# Patient Record
Sex: Female | Born: 1975 | Race: White | Hispanic: No | Smoking: Former smoker
Health system: Southern US, Community
[De-identification: ages and names within clinical notes are randomized; demographics above are authoritative.]

## PROBLEM LIST (undated history)

## (undated) DIAGNOSIS — N200 Calculus of kidney: Secondary | ICD-10-CM

## (undated) HISTORY — PX: BOWEL RESECTION: SHX1257

## (undated) HISTORY — PX: APPENDECTOMY: SHX54

## (undated) HISTORY — PX: TUBAL LIGATION: SHX77

---

## 1998-12-29 ENCOUNTER — Other Ambulatory Visit: Admission: RE | Admit: 1998-12-29 | Discharge: 1998-12-29 | Payer: Self-pay | Admitting: Physical Therapy

## 2000-01-12 ENCOUNTER — Other Ambulatory Visit: Admission: RE | Admit: 2000-01-12 | Discharge: 2000-01-12 | Payer: Self-pay | Admitting: Obstetrics and Gynecology

## 2000-06-05 ENCOUNTER — Encounter: Payer: Self-pay | Admitting: Surgery

## 2000-06-05 ENCOUNTER — Encounter (INDEPENDENT_AMBULATORY_CARE_PROVIDER_SITE_OTHER): Payer: Self-pay | Admitting: Specialist

## 2000-06-06 ENCOUNTER — Inpatient Hospital Stay (HOSPITAL_COMMUNITY): Admission: AD | Admit: 2000-06-06 | Discharge: 2000-06-07 | Payer: Self-pay | Admitting: Obstetrics and Gynecology

## 2000-11-06 ENCOUNTER — Other Ambulatory Visit: Admission: RE | Admit: 2000-11-06 | Discharge: 2000-11-06 | Payer: Self-pay | Admitting: Obstetrics and Gynecology

## 2001-06-26 ENCOUNTER — Other Ambulatory Visit: Admission: RE | Admit: 2001-06-26 | Discharge: 2001-06-26 | Payer: Self-pay | Admitting: Obstetrics and Gynecology

## 2001-10-06 ENCOUNTER — Emergency Department (HOSPITAL_COMMUNITY): Admission: EM | Admit: 2001-10-06 | Discharge: 2001-10-06 | Payer: Self-pay | Admitting: Emergency Medicine

## 2002-03-30 ENCOUNTER — Emergency Department (HOSPITAL_COMMUNITY): Admission: EM | Admit: 2002-03-30 | Discharge: 2002-03-30 | Payer: Self-pay | Admitting: Emergency Medicine

## 2002-09-20 ENCOUNTER — Other Ambulatory Visit: Admission: RE | Admit: 2002-09-20 | Discharge: 2002-09-20 | Payer: Self-pay | Admitting: Obstetrics and Gynecology

## 2002-12-29 ENCOUNTER — Emergency Department (HOSPITAL_COMMUNITY): Admission: EM | Admit: 2002-12-29 | Discharge: 2002-12-29 | Payer: Self-pay | Admitting: Emergency Medicine

## 2003-12-20 ENCOUNTER — Emergency Department (HOSPITAL_COMMUNITY): Admission: EM | Admit: 2003-12-20 | Discharge: 2003-12-21 | Payer: Self-pay | Admitting: Emergency Medicine

## 2004-11-03 ENCOUNTER — Other Ambulatory Visit: Admission: RE | Admit: 2004-11-03 | Discharge: 2004-11-03 | Payer: Self-pay | Admitting: Obstetrics and Gynecology

## 2004-11-26 ENCOUNTER — Inpatient Hospital Stay (HOSPITAL_COMMUNITY): Admission: AD | Admit: 2004-11-26 | Discharge: 2004-11-26 | Payer: Self-pay | Admitting: Obstetrics and Gynecology

## 2007-07-23 ENCOUNTER — Emergency Department (HOSPITAL_COMMUNITY): Admission: EM | Admit: 2007-07-23 | Discharge: 2007-07-23 | Payer: Self-pay | Admitting: Emergency Medicine

## 2008-01-02 ENCOUNTER — Emergency Department (HOSPITAL_COMMUNITY): Admission: EM | Admit: 2008-01-02 | Discharge: 2008-01-02 | Payer: Self-pay | Admitting: Emergency Medicine

## 2008-02-09 ENCOUNTER — Emergency Department (HOSPITAL_COMMUNITY): Admission: EM | Admit: 2008-02-09 | Discharge: 2008-02-09 | Payer: Self-pay | Admitting: Emergency Medicine

## 2008-03-03 ENCOUNTER — Emergency Department (HOSPITAL_COMMUNITY): Admission: EM | Admit: 2008-03-03 | Discharge: 2008-03-04 | Payer: Self-pay | Admitting: *Deleted

## 2008-05-23 ENCOUNTER — Emergency Department (HOSPITAL_COMMUNITY): Admission: EM | Admit: 2008-05-23 | Discharge: 2008-05-23 | Payer: Self-pay | Admitting: Emergency Medicine

## 2008-08-23 ENCOUNTER — Emergency Department (HOSPITAL_COMMUNITY): Admission: EM | Admit: 2008-08-23 | Discharge: 2008-08-23 | Payer: Self-pay | Admitting: Family Medicine

## 2009-01-11 ENCOUNTER — Emergency Department (HOSPITAL_COMMUNITY): Admission: EM | Admit: 2009-01-11 | Discharge: 2009-01-11 | Payer: Self-pay | Admitting: Family Medicine

## 2009-02-14 HISTORY — PX: OTHER SURGICAL HISTORY: SHX169

## 2010-05-26 LAB — POCT PREGNANCY, URINE: Preg Test, Ur: NEGATIVE

## 2010-07-02 NOTE — Discharge Summary (Signed)
[facility]  Patient:    [name], [name]                    MRN: [id number] Adm. Date:  [id number] Disc. Date: [id number] Attending:  Tobey Bride                           Discharge Summary  HISTORY OF PRESENT ILLNESS:   [name] is a 35 year old nulligravida who was evaluated for right lower quadrant pain.  Had normal ultrasound and pelvic examination.  Was sent to the emergency room for evaluation of appendicitis.  After two CAT scans and evaluation by Dr. Jamey Ripa, it is felt that this did not represent appendicitis and was admitted for pain control for right lower quadrant pain and what is presumed by CAT scan as being a right hydrosalpinx.  HOSPITAL COURSE:              Patient was admitted and placed on IV fluids and IV pain medication with morphine sulfate.  She remained afebrile and normal white count.  Because the pain did not improve on hospital day #1, proceeded with laparoscopy for evaluation of right lower quadrant pain.  At the time of surgery normal GYN structures were noted including normal uterus, tubes, ovaries, and pelvis.  Intraoperative consult with general surgery also revealed a normal appearing appendix.  However, at that time appendectomy was performed.  The surgery was uncomplicated.  The patients postoperative course was unremarkable with good return of bowel function, ambulation, and pain control.  Postoperative day #1 the right lower quadrant pain is absent and her postoperative course appears to be very normal.  She remains afebrile and will be discharged home.  DISPOSITION:                  Patient will be discharged home with followup in the office in one week.  She was sent home with routine instruction sheets for laparoscopy.  Told to return for increased fever, pain, or problems with the incision.  She was sent home with a prescription for Vicodin #30.  DISCHARGE DIAGNOSES:          Right lower quadrant  pain, probable early appendicitis. DD:  06/07/00 TD:  06/07/00 Job: [id number]

## 2010-07-02 NOTE — H&P (Signed)
[facility]  Patient:    [name], [name]                    MRN: [id number] Adm. Date:  [id number] Attending:  Rhina Brackett CC:         Trevor Iha, M.D.   History and Physical  ACCOUNT NUMBER:  [id number]  CHIEF COMPLAINT:  Abdominal pain.  CLINICAL HISTORY:  This patient is a 35 year old who woke up early this morning with right lower quadrant pain.  It has persisted and remained in the right lower quadrant.  She became nauseated shortly after it came on and she has not been hungry the rest of the day and remained somewhat nauseated.  She had some diarrhea right after she got up and the pain had begun and has not had any diarrhea since then.  She has not had any other recent GI symptoms. She has not eaten anything unusual, not had any fever or chills and has otherwise felt okay.  She supposedly had a low-grade temperature in the 99s but we do not have a record of that.  Because of her right lower quadrant pain, she was seen by her gynecologist who also apparently did a pelvic ultrasound which was negative.  He asked [place] to evaluate her for possible appendicitis.  The patient points to the right lower quadrant as the site of her pain.  PAST MEDICAL HISTORY:  Operations:  None.  MEDICATIONS:  Birth control pills.  ALLERGIES:  PENICILLIN and SULFA, both of which cause swelling.  HABITS:  Smokes occasionally.  FAMILY HISTORY:  Unremarkable.  REVIEW OF SYSTEMS:  HEENT:  Negative.  CHEST:  No cough or shortness of breath.  HEART:  No history of murmurs, hypertension or other cardiac problems.  GI:  Negative except for HPI.  GU:  Negative.  Has regular menstrual periods; last period was about a week ago.  EXTREMITIES:  Negative.  PHYSICAL EXAMINATION:  GENERAL:  Patient is a healthy-appearing female, alert, oriented and currently in no distress.  HEENT:  Head is normocephalic.  Eyes nonicteric.  Pupils are round  and regular.  NECK:  Supple.  No masses or thyromegaly.  LUNGS:  Clear to auscultation.  HEART:  Regular.  No murmurs, rubs, or gallops.  ABDOMEN:  Soft and nondistended.  She has some definite mild right lower quadrant tenderness but no guarding.  No rebound.  Bowel sounds are present.  PELVIC:  Pelvic was not done; it was negative by her gynecologist.  EXTREMITIES:  No cyanosis or edema.  LABORATORY DATA:  Laboratory studies currently available include negative urine pregnancy test and WBC of 8000 with hemoglobin of 11.2.  CMET is pending.  IMPRESSION:  Right lower quadrant pain, somewhat atypical for appendicitis, with normal white count.  PLAN:  Because of the atypical symptoms, we are going to try to get initially non-contrast CT to see if we can rule in or out appendicitis and depending on that, proceed either to a full CT or other depending on whether we can clearly identify the appendix.  We have discussed this with the patient and her mother, all questions answered. DD:  06/05/00 TD:  06/06/00 Job: [id number]

## 2010-07-02 NOTE — Op Note (Signed)
[facility]  Patient:    [name], [name]                    MRN: [id number] Proc. Date: 06/05/00 Adm. Date:  [id number] Disc. Date: [id number] Attending:  Tobey Bride CC:         Trevor Iha, M.D.   Operative Report  PREOPERATIVE DIAGNOSIS:       Abdominal pain of unknown etiology.  POSTOPERATIVE DIAGNOSIS:      Abdominal pain of unknown etiology.  OPERATION:                    Appendectomy.  SURGEON:                      Zigmund Daniel, M.D.  ASSISTANT:  ANESTHESIA:                   General anesthesia.  DESCRIPTION OF PROCEDURE:     The patient had been operated on by Trevor Iha, M.D., performing laparoscopy because of very acute right lower quadrant abdominal pain.  The ultrasound had shown hydrosalpinx and appendicitis was not suspected.  However, Dr. Rana Snare did not find findings consistent with cause for pelvic pain and he was concerned that the patient might need an appendectomy.  I was asked to consult and see the patient and I came to the OR and scrubbed in and did a laparoscopy.  Dr. Rana Snare went ahead and drained a small right ovarian cyst but he did not feel that was the cause of pain.  Small-bowel, gallbladder, colon, mesentery and peritoneal surfaces all looked completely normal.  The appendix was quite long but did not appear to be acutely inflamed.  There were a few adhesions around it.  I put in a second large port and dissecting with bipolar cautery, I took down the adhesions and freed up the mesentery and the appendix.  They were quite thin at the base and the appendix was very healthy-looking at the base.  I stapled the mesoappendix and appendix with one firing of the endoscopic vascular stapler and it made a quite secure amputation of the appendix with a secure closure of the mesentery.  No bleeding at all was noted.  I then removed the appendix in a plastic pouch through the second large port in the left  lower quadrant. Again, I looked around to make sure I found no abnormalities and none were found.  Dr. Rana Snare then concluded his examination and operation. DD:  06/06/00 TD:  06/07/00 Job: [id number]

## 2010-07-02 NOTE — Op Note (Signed)
[facility]  Patient:    [name], [name]                    MRN: [id number] Proc. Date: 06/06/00 Adm. Date:  [id number] Disc. Date: [id number] Attending:  Rhina Brackett                           Operative Report  PREOPERATIVE DIAGNOSIS:       Right lower quadrant pain, presumed right                               hydrosalpinx.  POSTOPERATIVE DIAGNOSIS:      Right lower quadrant pain, presumed                               appendicitis.  OPERATION:                    Diagnostic laparoscopy with appendectomy.  SURGEON:                      Trevor Iha, M.D.  ASSISTANT:                    Zigmund Daniel, M.D.  ANESTHESIA:                   General endotracheal anesthesia.  ESTIMATED BLOOD LOSS:         10 cc.  INDICATIONS:                  [name] is a 35 year old nulligravida white female who presented to my office on June 05, 2000, with acute onset of right lower quadrant pain at 3 a.m. associated with anorexia, nausea, and some low grade fever.  Ultrasound was performed which was normal.  The patient had right lower quadrant pain to palpation but no rebound.  Pelvic exam was normal.  She was sent to be evaluated by Currie Paris, M.D., at the [facility] Emergency Room and underwent laboratory evaluation as well as CAT scan.  They felt that it was not consistent with appendicitis.  The CAT scan showed a probable right hydrosalpinx.  She was admitted to [facility], continued on IV fluids and morphine for right lower quadrant pain.  On the morning of June 06, 2000, she continued to have the right lower quadrant pain; however, continued to remain afebrile and her white count was 6.5. Because of the persistent right lower quadrant pain and a possible right hydrosalpinx by CAT scan we planned to proceed with laparoscopy for evaluation and possible treatment of this.  Risks and benefits were discussed at  length, including but not limited to, risk of infection, bleeding, damage to bowel, bladder, uterus, tubes and ovaries.  The patient gave her informed consent.  FINDINGS AT SURGERY:          Normal-appearing uterus. Both tubes were normal. The was a small right follicular cyst on the right ovary.  The appendix was also grossly normal in appearance as was the liver and gallbladder.  DESCRIPTION OF PROCEDURE:     After adequate analgesia, the patient was placed in the dorsal lithotomy position.  She was sterilely prepped and draped.  The bladder was sterilely drained.  A Hulka tenaculum was placed on the  cervix.  A 1 cm infraumbilical skin incision was made.  Verres needle was inserted.  The abdomen was insufflated with dullness to percussion. The 11 mm trocar was inserted and the above findings were noted.  A 5 mm trocar was inserted to the left of midline two fingerbreadths above the pubic symphysis under direct visualization.  After examination of the pelvis, identifying normal-appearing tubes, ovaries bilaterally and the uterus, the right follicular cyst was drained and noted to have clear fluid.  Examination of the liver and gallbladder were normal.  The appendix appeared to be somewhat lengthy; however, no apparent induration.  I called Dr. Marcy Panning, general surgeon, for evaluation of the appendix.  At that time, he examined, he did also agree that it appeared to be grossly normal in appearance; however, because of her present pain, he felt that appendectomy would be indicated at this time.  At this time, he preceded to dissect the mesosalpinx with bipolar cautery and Endo shears.  He made a 1 cm incision on the left mid portion of the abdomen, inserted a 12 mm trocar.  Endo GIA was placed across the base of the mesosalpinx and appendix.  After firing of the Endo GIA, minimal appendiceal stump was left.  Good hemostasis was achieved.  The remaining portion of the appendix and  mesosalpinx were placed in the Endo catch bag and easily removed through the trocar site. At this time, examination of the cecum and the appendiceal stump revealed good hemostasis and no evidence of leaking.  At this time, the laparoscope was removed, the abdomen was desufflated, the trocar sites were closed.  The infraumbilical skin incision and mid abdominal incision were closed with 0 Vicryl in the fascia in a figure-of-eight.  The subcuticular layers were closed with 3-0 Vicryl Rapide in subcuticular fashion and the 5 mm site was closed with subcuticular suture of 3-0 Vicryl Rapide. They were injected with 0.25% Marcaine.  Tenaculum was removed from the cervix, noted to be hemostatic.  The patient tolerated the procedure well and was stable on transfer to the recovery room.  Estimated blood loss was 10 cc. The patient was given 1 g of cefotetan and will be continued in observation on the hospital floor for her right lower quadrant pain.  Sponge and instrument count was normal x 3. DD:  06/06/00 TD:  06/07/00 Job: [id number]

## 2012-03-27 ENCOUNTER — Other Ambulatory Visit: Payer: Self-pay

## 2012-03-27 LAB — OB RESULTS CONSOLE HEPATITIS B SURFACE ANTIGEN: Hepatitis B Surface Ag: NEGATIVE

## 2012-03-27 LAB — OB RESULTS CONSOLE RPR: RPR: NONREACTIVE

## 2012-05-15 ENCOUNTER — Ambulatory Visit (HOSPITAL_COMMUNITY)
Admission: RE | Admit: 2012-05-15 | Discharge: 2012-05-15 | Disposition: A | Discharge status: Home / Self Care | Payer: 59 | Source: Ambulatory Visit | Attending: Obstetrics and Gynecology | Admitting: Obstetrics and Gynecology

## 2012-05-15 ENCOUNTER — Other Ambulatory Visit: Payer: Self-pay

## 2012-05-15 ENCOUNTER — Encounter (HOSPITAL_COMMUNITY): Payer: Self-pay

## 2012-05-15 DIAGNOSIS — O09529 Supervision of elderly multigravida, unspecified trimester: Secondary | ICD-10-CM | POA: Insufficient documentation

## 2012-05-15 DIAGNOSIS — IMO0002 Reserved for concepts with insufficient information to code with codable children: Secondary | ICD-10-CM | POA: Insufficient documentation

## 2012-05-15 NOTE — Progress Notes (Signed)
Genetic Counseling  High-Risk Gestation Note  Appointment Date:  05/15/2012 Referred By: Turner Daniels, MD Date of Birth:  [birth year]    Pregnancy History: G2P0010 Estimated Date of Delivery: 10/04/12 Estimated Gestational Age: [redacted]w[redacted]d Attending: Particia Nearing, MD    Ms. [name] was seen for genetic counseling because of a maternal age of 37 years old at delivery.     She was counseled regarding maternal age and the association with risk for chromosome conditions due to nondisjunction with aging of the ova.   We reviewed chromosomes, nondisjunction, and the associated 1 in 141 risk for fetal aneuploidy at [redacted]w[redacted]d gestation related to a maternal age of 37 y.o. at delivery.  She was counseled that the risk for aneuploidy decreases as gestational age increases, accounting for those pregnancies which spontaneously abort.  We specifically discussed Down syndrome (trisomy 7), trisomies 86 and 13, and sex chromosome aneuploidies (47,XXX and 47,XXY) including the common features and prognoses of each.   We reviewed available screening options including Quad screen, noninvasive prenatal testing (NIPT), and detailed ultrasound. She understands that screening tests are used to modify a patient's a priori risk for aneuploidy, typically based on age.  This estimate provides a pregnancy specific risk assessment.  Specifically, we discussed that NIPT analyzes cell free fetal DNA found in the maternal circulation. This test is not diagnostic for chromosome conditions, but can provide information regarding the presence or absence of extra fetal DNA for chromosomes 13, 18, 21, X, and Y, and missing fetal DNA for chromosome X and Y (Turner syndrome). Thus, it would not identify or rule out all genetic conditions. The reported detection rate is greater than 99% for Trisomy 21, greater than 98% for Trisomy 18, and is approximately 80% (8 out of 10) for Trisomy 13. The false positive rate is reported to be less than  0.1% for any of these conditions.  In addition, we discussed that ~50-80% of fetuses with Down syndrome and up to 90-95% of fetuses with trisomy 18/13, when well visualized, have detectable anomalies or soft markers by detailed ultrasound (~18+ weeks gestation).   We reviewed that ultrasound was performed in Ms. [name] OB office on 05/10/12. Ultrasound reported normal visualized fetal anatomy. An echogenic intracardiac focus was reportedly visualized at that time. An isolated echogenic focus is generally believed to be a normal variation without any concerns for the pregnancy.  Isolated echogenic cardiac foci are not associated with congenital heart defects in the baby or compromised cardiac function after birth.  However, an echogenic cardiac focus is associated with a slightly increased chance for Down syndrome in the pregnancy. Thus, the presence of an EIF would increase the risk for Down syndrome above the patient's age related risk of 1 in 256 to approximately 1 in 128. Ultrasound was not performed at the time of today's visit.   Ms. [name] previously attempted NIPT through her primary OB office on 03/27/12 and 04/16/12. Both samples reportedly had insufficient quantity of DNA. We reviewed that the analysis is not able to accurately be performed in samples with low amounts of cell free fetal DNA. We discussed that typically the amount of cell free fetal DNA increases throughout gestation, with a more significant increase typically occurring in the early third trimester. However, we discussed that there are some pregnancies that will not have an adequate increase in the amount of cell free fetal DNA in maternal serum regardless of gestational age.  She was also counseled regarding diagnostic testing via amniocentesis.  We reviewed the approximate 1 in 300-500 risk for complications for amniocentesis, including spontaneous pregnancy loss. We discussed the risks, limitations, and benefits of  each screening and testing option. After consideration of all the options, Ms. [name] elected to attempt NIPT again [facility]). However, she also elected to proceed with Quad screen at the time of today's visit; in the case that the third NIPT sample also does not yield a result, she will have risk assessment provided via Quad screen. She understands that Quad screen has decreased detection rate and increased false positive rate compared to NIPT. However, she also understands that Quad result is not dependent upon the amount of fetal DNA in maternal blood and that the detection rate for the conditions screened is increased above maternal age as a screening tool. We will contact the patient once results are available from these screens or if NIPT is unable to be performed due to cell free fetal DNA fraction in sample. She understands that screening cannot rule out all birth defects or genetic syndromes. The patient was advised of this limitation and states she still does not want diagnostic testing at this time or in the future given the associated risk of complications.   Ms. [name] was provided with written information regarding cystic fibrosis (CF) including the carrier frequency and incidence in the Caucasian population, the availability of carrier testing and prenatal diagnosis if indicated.  In addition, we discussed that CF is routinely screened for as part of the [place] newborn screening panel.  She declined testing today.   Both family histories were reviewed and found to be noncontributory for birth defects, mental retardation, and known genetic conditions. The father of the pregnancy was adopted, and he does not have information regarding his biological family history.  We, therefore, cannot comment on how his history might contribute to the overall chance for the baby to have a birth defect. Without further information regarding the provided family history, an accurate genetic risk  cannot be calculated. Further genetic counseling is warranted if more information is obtained.  Ms. [name] denied exposure to environmental toxins or chemical agents. She denied the use of alcohol, tobacco or street drugs. She denied significant viral illnesses during the course of her pregnancy. Her medical and surgical histories were noncontributory.   I counseled Ms. [name] regarding the above risks and available options.  The approximate face-to-face time with the genetic counselor was 40 minutes.  Alicia Plowman, MS,  Certified Genetic Counselor 05/15/2012

## 2012-05-18 ENCOUNTER — Telehealth (HOSPITAL_COMMUNITY): Payer: Self-pay | Admitting: MS"

## 2012-05-18 NOTE — Telephone Encounter (Signed)
Patient returned call. Called Ms. [name] to discuss results of Quad screen, which are within normal range for the conditions screened. We reviewed the reduction in risks for fetal Down syndrome (1 in 401 to 1 in 2,324), trisomy 18 (1 in 1,205 to 1 in 9,588), and ONTDs. Quad screen detects approximately 75-80% of Down syndrome and approximately 60-65% of trisomy 18. She understands that this screen does not diagnose or rule out these conditions and does not assess for additional genetic or chromosome conditions.   Cell free fetal DNA test results are currently pending. Will call patient once results are available or if lab is unable to obtain result due to low fetal fraction. All questions were answered to her satisfaction. Patient was encouraged to call with additional questions or concerns.   Clydie Braun Myrick Mcnairy 05/18/2012 9:37 AM    Left message for patient to call back regarding "good news."  Clydie Braun Harlem Bula 05/18/2012 9:28 AM

## 2012-05-24 ENCOUNTER — Telehealth (HOSPITAL_COMMUNITY): Payer: Self-pay | Admitting: MS"

## 2012-05-24 NOTE — Telephone Encounter (Signed)
Called Donya Minichiello to discuss her [place], cell free fetal DNA testing. Patient was identified by name and DOB. Testing was offered because of advanced maternal age. We reviewed that a result was not able to be obtained due to insufficient fetal cell free DNA in the sample. Ms. Soyars attempted noninvasive prenatal screening two times previously in the pregnancy. Discussed that the laboratories performing this testing have indicated that there is a subset of the population who does not appear to have sufficient cell free fetal DNA in maternal blood stream regardless of gestation for unknown reasons. However, in general, there does seem to be an increase in cell free fetal DNA in maternal blood in the third trimester if the patient wanted to attempt another re-draw. Ms. Ribble expressed that she was hoping for a result but that she is comfortable given that the had risk assessment performed via Quad screen, which reduced the risks for the conditions screened below her age-related risks. Reviewed the option of amniocentesis. Ms. Kirchner indicated that she is not interested in amniocentesis. She plans to discuss this information with her primary OB at her next visit. Reviewed that her OB office should have a copy of the Quad screen result and will get information regarding the non-reportable NIPS result as well.    Quinn Plowman, MS Certified Genetic Counselor 05/24/2012 3:02 PM

## 2012-09-07 LAB — OB RESULTS CONSOLE GBS: GBS: NEGATIVE

## 2012-09-29 ENCOUNTER — Encounter (HOSPITAL_COMMUNITY)
Admission: AD | Disposition: A | Discharge status: Home / Self Care | Payer: Self-pay | Source: Ambulatory Visit | Attending: Obstetrics and Gynecology

## 2012-09-29 ENCOUNTER — Inpatient Hospital Stay (HOSPITAL_COMMUNITY)
Admission: AD | Admit: 2012-09-29 | Discharge: 2012-10-01 | DRG: 766 | Disposition: A | Discharge status: Home / Self Care | Payer: 59 | Source: Ambulatory Visit | Attending: Obstetrics and Gynecology | Admitting: Obstetrics and Gynecology

## 2012-09-29 ENCOUNTER — Encounter (HOSPITAL_COMMUNITY): Payer: Self-pay | Admitting: Anesthesiology

## 2012-09-29 ENCOUNTER — Encounter (HOSPITAL_COMMUNITY): Payer: Self-pay | Admitting: *Deleted

## 2012-09-29 ENCOUNTER — Inpatient Hospital Stay (HOSPITAL_COMMUNITY): Payer: 59 | Admitting: Anesthesiology

## 2012-09-29 DIAGNOSIS — Z98891 History of uterine scar from previous surgery: Secondary | ICD-10-CM

## 2012-09-29 DIAGNOSIS — Z880 Allergy status to penicillin: Secondary | ICD-10-CM

## 2012-09-29 DIAGNOSIS — O139 Gestational [pregnancy-induced] hypertension without significant proteinuria, unspecified trimester: Principal | ICD-10-CM | POA: Diagnosis present

## 2012-09-29 DIAGNOSIS — O09529 Supervision of elderly multigravida, unspecified trimester: Secondary | ICD-10-CM | POA: Diagnosis present

## 2012-09-29 LAB — CBC
MCV: 85 fL (ref 78.0–100.0)
Platelets: 286 10*3/uL (ref 150–400)
RBC: 4.27 MIL/uL (ref 3.87–5.11)
RDW: 12.9 % (ref 11.5–15.5)
WBC: 13.8 10*3/uL — ABNORMAL HIGH (ref 4.0–10.5)

## 2012-09-29 LAB — POCT FERN TEST: POCT Fern Test: NEGATIVE

## 2012-09-29 LAB — RPR: RPR Ser Ql: NONREACTIVE

## 2012-09-29 SURGERY — Surgical Case
Anesthesia: Epidural | Site: Abdomen | Wound class: Clean Contaminated

## 2012-09-29 MED ORDER — PHENYLEPHRINE 40 MCG/ML (10ML) SYRINGE FOR IV PUSH (FOR BLOOD PRESSURE SUPPORT): 80.0000 ug | PREFILLED_SYRINGE | INTRAVENOUS | Status: DC | PRN

## 2012-09-29 MED ORDER — ONDANSETRON HCL 4 MG/2ML IJ SOLN: 4.0000 mg | Freq: Four times a day (QID) | INTRAMUSCULAR | Status: DC | PRN

## 2012-09-29 MED ORDER — KETOROLAC TROMETHAMINE 30 MG/ML IJ SOLN
30.0000 mg | Freq: Four times a day (QID) | INTRAMUSCULAR | Status: AC | PRN
  Administered 2012-09-30: 30 mg via INTRAVENOUS
  Filled 2012-09-29: qty 1

## 2012-09-29 MED ORDER — KETOROLAC TROMETHAMINE 30 MG/ML IJ SOLN: 30.0000 mg | Freq: Four times a day (QID) | INTRAMUSCULAR | Status: AC | PRN

## 2012-09-29 MED ORDER — KETOROLAC TROMETHAMINE 60 MG/2ML IM SOLN
INTRAMUSCULAR | Status: AC
  Administered 2012-09-29: 60 mg via INTRAMUSCULAR
  Filled 2012-09-29: qty 2

## 2012-09-29 MED ORDER — EPHEDRINE 5 MG/ML INJ
10.0000 mg | INTRAVENOUS | Status: DC | PRN
  Filled 2012-09-29: qty 4

## 2012-09-29 MED ORDER — SIMETHICONE 80 MG PO CHEW: 80.0000 mg | CHEWABLE_TABLET | ORAL | Status: DC | PRN

## 2012-09-29 MED ORDER — OXYTOCIN 10 UNIT/ML IJ SOLN
INTRAMUSCULAR | Status: AC
  Filled 2012-09-29: qty 4

## 2012-09-29 MED ORDER — LACTATED RINGERS IV SOLN
500.0000 mL | Freq: Once | INTRAVENOUS | Status: AC
  Administered 2012-09-29: 500 mL via INTRAVENOUS

## 2012-09-29 MED ORDER — METOCLOPRAMIDE HCL 5 MG/ML IJ SOLN: 10.0000 mg | Freq: Three times a day (TID) | INTRAMUSCULAR | Status: DC | PRN

## 2012-09-29 MED ORDER — 0.9 % SODIUM CHLORIDE (POUR BTL) OPTIME
TOPICAL | Status: DC | PRN
  Administered 2012-09-29: 600 mL

## 2012-09-29 MED ORDER — ONDANSETRON HCL 4 MG/2ML IJ SOLN
INTRAMUSCULAR | Status: AC
  Filled 2012-09-29: qty 2

## 2012-09-29 MED ORDER — LACTATED RINGERS IV SOLN: INTRAVENOUS | Status: DC

## 2012-09-29 MED ORDER — OXYTOCIN BOLUS FROM INFUSION: 500.0000 mL | INTRAVENOUS | Status: DC

## 2012-09-29 MED ORDER — LIDOCAINE HCL (PF) 1 % IJ SOLN
INTRAMUSCULAR | Status: DC | PRN
  Administered 2012-09-29 (×3): 5 mL

## 2012-09-29 MED ORDER — FENTANYL CITRATE 0.05 MG/ML IJ SOLN
25.0000 ug | INTRAMUSCULAR | Status: DC | PRN
  Administered 2012-09-29: 50 ug via INTRAVENOUS

## 2012-09-29 MED ORDER — SENNOSIDES-DOCUSATE SODIUM 8.6-50 MG PO TABS
2.0000 | ORAL_TABLET | Freq: Every day | ORAL | Status: DC
  Administered 2012-09-30: 2 via ORAL

## 2012-09-29 MED ORDER — OXYTOCIN 40 UNITS IN LACTATED RINGERS INFUSION - SIMPLE MED
1.0000 m[IU]/min | INTRAVENOUS | Status: DC
  Administered 2012-09-29: 2 m[IU]/min via INTRAVENOUS

## 2012-09-29 MED ORDER — ONDANSETRON HCL 4 MG/2ML IJ SOLN
INTRAMUSCULAR | Status: DC | PRN
  Administered 2012-09-29: 4 mg via INTRAVENOUS

## 2012-09-29 MED ORDER — LACTATED RINGERS IV SOLN
INTRAVENOUS | Status: DC
  Administered 2012-09-29 (×2): via INTRAVENOUS

## 2012-09-29 MED ORDER — OXYTOCIN 10 UNIT/ML IJ SOLN
40.0000 [IU] | INTRAVENOUS | Status: DC | PRN
  Administered 2012-09-29: 40 [IU] via INTRAVENOUS

## 2012-09-29 MED ORDER — FLEET ENEMA 7-19 GM/118ML RE ENEM: 1.0000 | ENEMA | Freq: Once | RECTAL | Status: DC

## 2012-09-29 MED ORDER — MEPERIDINE HCL 25 MG/ML IJ SOLN: 6.2500 mg | INTRAMUSCULAR | Status: DC | PRN

## 2012-09-29 MED ORDER — NALOXONE HCL 0.4 MG/ML IJ SOLN: 0.4000 mg | INTRAMUSCULAR | Status: DC | PRN

## 2012-09-29 MED ORDER — IBUPROFEN 600 MG PO TABS
600.0000 mg | ORAL_TABLET | Freq: Four times a day (QID) | ORAL | Status: DC
  Administered 2012-09-30 – 2012-10-01 (×6): 600 mg via ORAL
  Filled 2012-09-29 (×6): qty 1

## 2012-09-29 MED ORDER — SODIUM BICARBONATE 8.4 % IV SOLN
INTRAVENOUS | Status: DC | PRN
  Administered 2012-09-29 (×2): 5 mL via EPIDURAL

## 2012-09-29 MED ORDER — NALOXONE HCL 1 MG/ML IJ SOLN
1.0000 ug/kg/h | INTRAVENOUS | Status: DC | PRN
  Filled 2012-09-29: qty 2

## 2012-09-29 MED ORDER — SCOPOLAMINE 1 MG/3DAYS TD PT72
1.0000 | MEDICATED_PATCH | Freq: Once | TRANSDERMAL | Status: DC
  Administered 2012-09-29: 1.5 mg via TRANSDERMAL

## 2012-09-29 MED ORDER — FENTANYL 2.5 MCG/ML BUPIVACAINE 1/10 % EPIDURAL INFUSION (WH - ANES)
14.0000 mL/h | INTRAMUSCULAR | Status: DC | PRN
  Filled 2012-09-29: qty 125

## 2012-09-29 MED ORDER — FENTANYL 2.5 MCG/ML BUPIVACAINE 1/10 % EPIDURAL INFUSION (WH - ANES)
INTRAMUSCULAR | Status: DC | PRN
  Administered 2012-09-29: 14 mL/h via EPIDURAL

## 2012-09-29 MED ORDER — DIPHENHYDRAMINE HCL 50 MG/ML IJ SOLN: 12.5000 mg | INTRAMUSCULAR | Status: DC | PRN

## 2012-09-29 MED ORDER — MORPHINE SULFATE (PF) 0.5 MG/ML IJ SOLN
INTRAMUSCULAR | Status: DC | PRN
  Administered 2012-09-29: 4 mg via EPIDURAL

## 2012-09-29 MED ORDER — ONDANSETRON HCL 4 MG/2ML IJ SOLN: 4.0000 mg | INTRAMUSCULAR | Status: DC | PRN

## 2012-09-29 MED ORDER — NALBUPHINE HCL 10 MG/ML IJ SOLN
5.0000 mg | INTRAMUSCULAR | Status: DC | PRN
  Filled 2012-09-29: qty 1

## 2012-09-29 MED ORDER — DIPHENHYDRAMINE HCL 25 MG PO CAPS: 25.0000 mg | ORAL_CAPSULE | Freq: Four times a day (QID) | ORAL | Status: DC | PRN

## 2012-09-29 MED ORDER — LACTATED RINGERS IV SOLN
500.0000 mL | INTRAVENOUS | Status: DC | PRN
  Administered 2012-09-29: 500 mL via INTRAVENOUS

## 2012-09-29 MED ORDER — MORPHINE SULFATE 0.5 MG/ML IJ SOLN
INTRAMUSCULAR | Status: AC
  Filled 2012-09-29: qty 10

## 2012-09-29 MED ORDER — ZOLPIDEM TARTRATE 5 MG PO TABS: 5.0000 mg | ORAL_TABLET | Freq: Every evening | ORAL | Status: DC | PRN

## 2012-09-29 MED ORDER — SODIUM BICARBONATE 8.4 % IV SOLN
INTRAVENOUS | Status: AC
  Filled 2012-09-29: qty 50

## 2012-09-29 MED ORDER — LANOLIN HYDROUS EX OINT: 1.0000 "application " | TOPICAL_OINTMENT | CUTANEOUS | Status: DC | PRN

## 2012-09-29 MED ORDER — MORPHINE SULFATE (PF) 0.5 MG/ML IJ SOLN
INTRAMUSCULAR | Status: DC | PRN
  Administered 2012-09-29: 1 mg via EPIDURAL

## 2012-09-29 MED ORDER — ACETAMINOPHEN 325 MG PO TABS: 650.0000 mg | ORAL_TABLET | ORAL | Status: DC | PRN

## 2012-09-29 MED ORDER — TETANUS-DIPHTH-ACELL PERTUSSIS 5-2.5-18.5 LF-MCG/0.5 IM SUSP: 0.5000 mL | Freq: Once | INTRAMUSCULAR | Status: DC

## 2012-09-29 MED ORDER — OXYCODONE-ACETAMINOPHEN 5-325 MG PO TABS
1.0000 | ORAL_TABLET | ORAL | Status: DC | PRN
  Administered 2012-09-30 (×2): 1 via ORAL
  Administered 2012-09-30: 2 via ORAL
  Administered 2012-09-30: 1 via ORAL
  Administered 2012-09-30: 2 via ORAL
  Administered 2012-10-01: 1 via ORAL
  Administered 2012-10-01 (×2): 2 via ORAL
  Filled 2012-09-29 (×2): qty 2
  Filled 2012-09-29 (×2): qty 1
  Filled 2012-09-29 (×3): qty 2
  Filled 2012-09-29: qty 1

## 2012-09-29 MED ORDER — DEXTROSE 5 % IV SOLN
2.0000 g | Freq: Two times a day (BID) | INTRAVENOUS | Status: DC
  Administered 2012-09-29: 2 g via INTRAVENOUS
  Filled 2012-09-29 (×2): qty 2

## 2012-09-29 MED ORDER — SCOPOLAMINE 1 MG/3DAYS TD PT72
MEDICATED_PATCH | TRANSDERMAL | Status: AC
  Filled 2012-09-29: qty 1

## 2012-09-29 MED ORDER — OXYCODONE-ACETAMINOPHEN 5-325 MG PO TABS: 1.0000 | ORAL_TABLET | ORAL | Status: DC | PRN

## 2012-09-29 MED ORDER — LACTATED RINGERS IV SOLN
INTRAVENOUS | Status: DC
  Administered 2012-09-29: 18:00:00 via INTRAUTERINE

## 2012-09-29 MED ORDER — PRENATAL MULTIVITAMIN CH
1.0000 | ORAL_TABLET | Freq: Every day | ORAL | Status: DC
  Administered 2012-09-30 – 2012-10-01 (×2): 1 via ORAL
  Filled 2012-09-29 (×2): qty 1

## 2012-09-29 MED ORDER — CITRIC ACID-SODIUM CITRATE 334-500 MG/5ML PO SOLN
30.0000 mL | ORAL | Status: DC | PRN
  Administered 2012-09-29 (×2): 30 mL via ORAL
  Filled 2012-09-29 (×2): qty 15

## 2012-09-29 MED ORDER — MENTHOL 3 MG MT LOZG: 1.0000 | LOZENGE | OROMUCOSAL | Status: DC | PRN

## 2012-09-29 MED ORDER — EPHEDRINE 5 MG/ML INJ: 10.0000 mg | INTRAVENOUS | Status: DC | PRN

## 2012-09-29 MED ORDER — MEPERIDINE HCL 25 MG/ML IJ SOLN
INTRAMUSCULAR | Status: DC | PRN
  Administered 2012-09-29 (×2): 12.5 mg via INTRAVENOUS

## 2012-09-29 MED ORDER — SIMETHICONE 80 MG PO CHEW
80.0000 mg | CHEWABLE_TABLET | Freq: Three times a day (TID) | ORAL | Status: DC
  Administered 2012-09-30 – 2012-10-01 (×5): 80 mg via ORAL

## 2012-09-29 MED ORDER — LIDOCAINE-EPINEPHRINE (PF) 2 %-1:200000 IJ SOLN
INTRAMUSCULAR | Status: AC
  Filled 2012-09-29: qty 20

## 2012-09-29 MED ORDER — ONDANSETRON HCL 4 MG PO TABS: 4.0000 mg | ORAL_TABLET | ORAL | Status: DC | PRN

## 2012-09-29 MED ORDER — MEPERIDINE HCL 25 MG/ML IJ SOLN
INTRAMUSCULAR | Status: AC
  Filled 2012-09-29: qty 1

## 2012-09-29 MED ORDER — SODIUM CHLORIDE 0.9 % IJ SOLN: 3.0000 mL | INTRAMUSCULAR | Status: DC | PRN

## 2012-09-29 MED ORDER — FENTANYL CITRATE 0.05 MG/ML IJ SOLN
INTRAMUSCULAR | Status: AC
  Administered 2012-09-29: 50 ug via INTRAVENOUS
  Filled 2012-09-29: qty 2

## 2012-09-29 MED ORDER — OXYTOCIN 40 UNITS IN LACTATED RINGERS INFUSION - SIMPLE MED
62.5000 mL/h | INTRAVENOUS | Status: DC
  Filled 2012-09-29: qty 1000

## 2012-09-29 MED ORDER — OXYTOCIN 40 UNITS IN LACTATED RINGERS INFUSION - SIMPLE MED: 62.5000 mL/h | INTRAVENOUS | Status: AC

## 2012-09-29 MED ORDER — DIPHENHYDRAMINE HCL 50 MG/ML IJ SOLN: 25.0000 mg | INTRAMUSCULAR | Status: DC | PRN

## 2012-09-29 MED ORDER — TERBUTALINE SULFATE 1 MG/ML IJ SOLN: 0.2500 mg | Freq: Once | INTRAMUSCULAR | Status: DC | PRN

## 2012-09-29 MED ORDER — WITCH HAZEL-GLYCERIN EX PADS: 1.0000 "application " | MEDICATED_PAD | CUTANEOUS | Status: DC | PRN

## 2012-09-29 MED ORDER — LACTATED RINGERS IV SOLN
INTRAVENOUS | Status: DC | PRN
  Administered 2012-09-29: 20:00:00 via INTRAVENOUS

## 2012-09-29 MED ORDER — DIBUCAINE 1 % RE OINT: 1.0000 "application " | TOPICAL_OINTMENT | RECTAL | Status: DC | PRN

## 2012-09-29 MED ORDER — IBUPROFEN 600 MG PO TABS: 600.0000 mg | ORAL_TABLET | Freq: Four times a day (QID) | ORAL | Status: DC | PRN

## 2012-09-29 MED ORDER — ONDANSETRON HCL 4 MG/2ML IJ SOLN: 4.0000 mg | Freq: Three times a day (TID) | INTRAMUSCULAR | Status: DC | PRN

## 2012-09-29 MED ORDER — DIPHENHYDRAMINE HCL 25 MG PO CAPS: 25.0000 mg | ORAL_CAPSULE | ORAL | Status: DC | PRN

## 2012-09-29 MED ORDER — PHENYLEPHRINE 40 MCG/ML (10ML) SYRINGE FOR IV PUSH (FOR BLOOD PRESSURE SUPPORT)
80.0000 ug | PREFILLED_SYRINGE | INTRAVENOUS | Status: DC | PRN
  Filled 2012-09-29: qty 5

## 2012-09-29 MED ORDER — LIDOCAINE HCL (PF) 1 % IJ SOLN: 30.0000 mL | INTRAMUSCULAR | Status: DC | PRN

## 2012-09-29 MED ORDER — KETOROLAC TROMETHAMINE 60 MG/2ML IM SOLN: 60.0000 mg | Freq: Once | INTRAMUSCULAR | Status: AC | PRN

## 2012-09-29 SURGICAL SUPPLY — 26 items
CLAMP CORD UMBIL (MISCELLANEOUS) IMPLANT
CLOTH BEACON ORANGE TIMEOUT ST (SAFETY) ×2 IMPLANT
DRAPE LG THREE QUARTER DISP (DRAPES) ×2 IMPLANT
DRSG OPSITE POSTOP 4X10 (GAUZE/BANDAGES/DRESSINGS) ×2 IMPLANT
DURAPREP 26ML APPLICATOR (WOUND CARE) ×2 IMPLANT
ELECT REM PT RETURN 9FT ADLT (ELECTROSURGICAL) ×2
ELECTRODE REM PT RTRN 9FT ADLT (ELECTROSURGICAL) ×1 IMPLANT
EXTRACTOR VACUUM M CUP 4 TUBE (SUCTIONS) IMPLANT
GLOVE SURG ORTHO 8.0 STRL STRW (GLOVE) ×2 IMPLANT
GOWN STRL REIN XL XLG (GOWN DISPOSABLE) ×4 IMPLANT
KIT ABG SYR 3ML LUER SLIP (SYRINGE) ×2 IMPLANT
NDL HYPO 25X5/8 SAFETYGLIDE (NEEDLE) ×1 IMPLANT
NEEDLE HYPO 25X5/8 SAFETYGLIDE (NEEDLE) ×2 IMPLANT
NS IRRIG 1000ML POUR BTL (IV SOLUTION) ×2 IMPLANT
PACK C SECTION WH (CUSTOM PROCEDURE TRAY) ×2 IMPLANT
PAD OB MATERNITY 4.3X12.25 (PERSONAL CARE ITEMS) ×2 IMPLANT
STAPLER VISISTAT 35W (STAPLE) IMPLANT
SUT MNCRL 0 VIOLET CTX 36 (SUTURE) ×3 IMPLANT
SUT MONOCRYL 0 CTX 36 (SUTURE) ×3
SUT PDS AB 1 CT  36 (SUTURE)
SUT PDS AB 1 CT 36 (SUTURE) IMPLANT
SUT VIC AB 1 CTX 36 (SUTURE)
SUT VIC AB 1 CTX36XBRD ANBCTRL (SUTURE) IMPLANT
TOWEL OR 17X24 6PK STRL BLUE (TOWEL DISPOSABLE) ×2 IMPLANT
TRAY FOLEY CATH 14FR (SET/KITS/TRAYS/PACK) ×2 IMPLANT
WATER STERILE IRR 1000ML POUR (IV SOLUTION) ×2 IMPLANT

## 2012-09-29 NOTE — Op Note (Signed)
Cesarean Section Procedure Note  Pre-operative Diagnosis: IUP at 39 3/7, nonreassuring FHR, recurrent late decels  Post-operative Diagnosis: same + OP presentation  Surgeon: Raihan Kimmel C   Assistants: none  Anesthesia:epidural  Procedure:  Low Segment Transverse cesarean section  Procedure Details  The patient was seen in the Holding Room. The risks, benefits, complications, treatment options, and expected outcomes were discussed with the patient.  The patient concurred with the proposed plan, giving informed consent.  The site of surgery properly noted/marked.. A Time Out was held and the above information confirmed.  After induction of anesthesia, the patient was draped and prepped in the usual sterile manner. A Pfannenstiel incision was made and carried down through the subcutaneous tissue to the fascia. Fascial incision was made and extended transversely. The fascia was separated from the underlying rectus tissue superiorly and inferiorly. The peritoneum was identified and entered. Peritoneal incision was extended longitudinally. The utero-vesical peritoneal reflection was incised transversely and the bladder flap was bluntly freed from the lower uterine segment. A low transverse uterine incision was made. Delivered from op presentation was a baby with Apgar scores of 9 at one minute and 9 at five minutes. After the umbilical cord was clamped and cut cord blood was obtained for evaluation. The placenta was removed intact and appeared normal. The uterine outline, tubes and ovaries appeared normal. The uterine incision was closed with running locked sutures of 0 monocryl and imbricated with 0 monocryl. Hemostasis was observed. Lavage was carried out until clear. The peritoneum was then closed with 0 monocryl and rectus muscles plicated in the midline.  After hemostasis was assured, the fascia was then reapproximated with running sutures of 0 Vicryl. Irrigation was applied and after adequate  hemostasis was assured, the skin was reapproximated with staples.  Instrument, sponge, and needle counts were correct prior the abdominal closure and at the conclusion of the case. The patient received 2 grams cefotetan preoperatively.  Findings: Viable female, ph art sent  Estimated Blood Loss:  600cc         Specimens: Placenta was sent to Pathology         Complications:  None

## 2012-09-29 NOTE — Anesthesia Procedure Notes (Signed)
Epidural Patient location during procedure: OB  Staffing Anesthesiologist: Norvin Ohlin Performed by: anesthesiologist   Preanesthetic Checklist Completed: patient identified, site marked, surgical consent, pre-op evaluation, timeout performed, IV checked, risks and benefits discussed and monitors and equipment checked  Epidural Patient position: sitting Prep: ChloraPrep Patient monitoring: heart rate, continuous pulse ox and blood pressure Approach: right paramedian Injection technique: LOR saline  Needle:  Needle type: Tuohy  Needle gauge: 17 G Needle length: 9 cm and 9 Needle insertion depth: 7 cm Catheter type: closed end flexible Catheter size: 20 Guage Catheter at skin depth: 14 cm Test dose: negative  Assessment Events: blood not aspirated, injection not painful, no injection resistance, negative IV test and no paresthesia  Additional Notes   Patient tolerated the insertion well without complications.   

## 2012-09-29 NOTE — MAU Note (Signed)
Pt presents with complaints of contractions that started yesterday but have gotten more regular this morning and are now 5 mins apart.

## 2012-09-29 NOTE — H&P (Signed)
[name] is a 37 y.o. female presenting for labor sxs.  Recent weeks with Gest HTN sxs and BPs 140s/80-90s.  No pih sxs.  Now with ctxs q 4-5 minutes and cx change . History OB History   Grav Para Term Preterm Abortions TAB SAB Ect Mult Living   2 0 0 0 1 1 0 0 0 0      History reviewed. No pertinent past medical history. Past Surgical History  Procedure Laterality Date  . Bowel resection    . Appendectomy     Family History: family history is not on file. Social History:  reports that she has quit smoking. She does not have any smokeless tobacco history on file. She reports that she does not drink alcohol or use illicit drugs.   Prenatal Transfer Tool  Maternal Diabetes: No Genetic Screening: Normal Maternal Ultrasounds/Referrals: Normal Fetal Ultrasounds or other Referrals:  None Maternal Substance Abuse:  No Significant Maternal Medications:  None Significant Maternal Lab Results:  None Other Comments:  None  ROS  Dilation: 3 Effacement (%): 90 Station: -2 Exam by:: [name]  Blood pressure 116/71, pulse 94, temperature 98.1 F (36.7 C), temperature source Oral, resp. rate 16, last menstrual period 12/29/2011. Exam Physical Exam  Prenatal labs: ABO, Rh:   Antibody:   Rubella:   RPR:    HBsAg:    HIV:    GBS:     Assessment/Plan: IUP at term in early labor. Hx of gest HTN now with normal Bps and neg PIG sxs With favorable cx and moderate ctxs, plan to admit and AROM Anticipate SVD   [name] C 09/29/2012, 11:58 AM

## 2012-09-29 NOTE — Progress Notes (Addendum)
CTSP for recurrent Late decels and varible decels.  FHR 140 -150s now with decreasing variability (Cat 2) IUPC placed.  Cx:  5-6/80/-2 Amnioinfusion began and Oxygen applied and pitocin stopped. Good fetal scalp stimulation accel.  After 30 minutes, continued to have recurrent late decels, with decreased variability, despite above maneuvers.  No cervical change noted.  Plan Primary LSCTS for nonreassuring FHR.   Risks and benefits of the procedure were discussed at length which include but not limited to risks of infection, injury to bowel, bladder, uterus, ovaries, risk of blood loss, blood transfusion, possible laparotomy, and risks associated with anesthesia.  She gives her informed consent and wishes to proceed. Pt gives history of allergy to PCN but has documented use of Cefotetan in 2002 without any problems.  Plan to proceed with cefotetan DL

## 2012-09-29 NOTE — MAU Provider Note (Signed)
  Sterile Speculum exam for rule out rupture of membranes Vagina - moderate amount of thick, mucous discharge; with bloody show, no odor Cervix - No contact bleeding Chaperone present for exam. Fern slide negative    GENERAL: Well-developed, well-nourished female in no acute distress.  SKIN: Warm, dry and without erythema PSYCH: Normal mood and affect  Iona Hansen Byard Carranza, NP 09/29/2012 11:30 AM

## 2012-09-29 NOTE — Anesthesia Preprocedure Evaluation (Addendum)
Anesthesia Evaluation  Patient identified by MRN, date of birth, ID band Patient awake    Reviewed: Allergy & Precautions, H&P , NPO status , Patient's Chart, lab work & pertinent test results  History of Anesthesia Complications Negative for: history of anesthetic complications  Airway Mallampati: II TM Distance: >3 FB Neck ROM: full    Dental no notable dental hx. (+) Teeth Intact   Pulmonary neg pulmonary ROS,  breath sounds clear to auscultation  Pulmonary exam normal       Cardiovascular negative cardio ROS  Rhythm:regular Rate:Normal     Neuro/Psych negative neurological ROS  negative psych ROS   GI/Hepatic negative GI ROS, Neg liver ROS,   Endo/Other  negative endocrine ROS  Renal/GU negative Renal ROS  negative genitourinary   Musculoskeletal   Abdominal Normal abdominal exam  (+)   Peds  Hematology negative hematology ROS (+)   Anesthesia Other Findings   Reproductive/Obstetrics (+) Pregnancy                           Anesthesia Physical Anesthesia Plan  ASA: II and emergent  Anesthesia Plan: Epidural   Post-op Pain Management:    Induction:   Airway Management Planned:   Additional Equipment:   Intra-op Plan:   Post-operative Plan:   Informed Consent: I have reviewed the patients History and Physical, chart, labs and discussed the procedure including the risks, benefits and alternatives for the proposed anesthesia with the patient or authorized representative who has indicated his/her understanding and acceptance.     Plan Discussed with:   Anesthesia Plan Comments: (To OR for C/S)       Anesthesia Quick Evaluation

## 2012-09-29 NOTE — OR Nursing (Signed)
50 ml blood loss during fundal massage by DLWegner RN, cord blood x 2 to OR front desk 

## 2012-09-29 NOTE — Anesthesia Postprocedure Evaluation (Signed)
  Anesthesia Post-op Note  Patient: [name]  Procedure(s) Performed: Procedure(s) (LRB): Primary CESAREAN SECTION  of baby boy at 106 APGAR 9/9 (N/A)  Patient Location: PACU  Anesthesia Type: Epidural  Level of Consciousness: awake and alert   Airway and Oxygen Therapy: Patient Spontanous Breathing  Post-op Pain: mild  Post-op Assessment: Post-op Vital signs reviewed, Patient's Cardiovascular Status Stable, Respiratory Function Stable, Patent Airway and No signs of Nausea or vomiting  Last Vitals:  Filed Vitals:   09/29/12 2045  BP: 96/47  Pulse: 78  Temp:   Resp: 17    Post-op Vital Signs: stable   Complications: No apparent anesthesia complications

## 2012-09-29 NOTE — Transfer of Care (Signed)
Immediate Anesthesia Transfer of Care Note  Patient: [name]  Procedure(s) Performed: Procedure(s): Primary CESAREAN SECTION  of baby boy at 80 APGAR 9/9 (N/A)  Patient Location: PACU  Anesthesia Type:Epidural  Level of Consciousness: awake and alert   Airway & Oxygen Therapy: Patient Spontanous Breathing  Post-op Assessment: Report given to PACU RN and Post -op Vital signs reviewed and stable  Post vital signs: stable  Complications: No apparent anesthesia complications

## 2012-09-30 ENCOUNTER — Encounter (HOSPITAL_COMMUNITY): Payer: Self-pay | Admitting: *Deleted

## 2012-09-30 LAB — CBC
HCT: 32.3 % — ABNORMAL LOW (ref 36.0–46.0)
Hemoglobin: 11.1 g/dL — ABNORMAL LOW (ref 12.0–15.0)
MCH: 29.4 pg (ref 26.0–34.0)
MCHC: 34.4 g/dL (ref 30.0–36.0)
RDW: 12.9 % (ref 11.5–15.5)

## 2012-09-30 MED ORDER — PNEUMOCOCCAL VAC POLYVALENT 25 MCG/0.5ML IJ INJ
0.5000 mL | INJECTION | INTRAMUSCULAR | Status: AC
  Administered 2012-10-01: 0.5 mL via INTRAMUSCULAR
  Filled 2012-09-30: qty 0.5

## 2012-09-30 NOTE — Progress Notes (Signed)
Subjective: Postpartum Day 1: Cesarean Delivery Patient reports tolerating PO, + flatus, + BM and no problems voiding.    Objective: Vital signs in last 24 hours: Temp:  [97.8 F (36.6 C)-99.6 F (37.6 C)] 98.3 F (36.8 C) (08/17 0650) Pulse Rate:  [50-105] 73 (08/17 0650) Resp:  [16-20] 18 (08/17 0650) BP: (96-150)/(47-110) 114/69 mmHg (08/17 0650) SpO2:  [95 %-100 %] 95 % (08/17 0650) Weight:  [86.183 kg (190 lb)] 86.183 kg (190 lb) (08/16 1250)  Physical Exam:  General: alert, cooperative, appears stated age and mild distress Lochia: appropriate Uterine Fundus: firm Incision: healing well DVT Evaluation: No evidence of DVT seen on physical exam.   Recent Labs  09/29/12 1205 09/30/12 0619  HGB 12.4 11.1*  HCT 36.3 32.3*    Assessment/Plan: Status post Cesarean section. Doing well postoperatively.  Continue present care.  Baby for Circ today .  Astraea Gaughran C 09/30/2012, 9:41 AM

## 2012-09-30 NOTE — Anesthesia Postprocedure Evaluation (Signed)
Anesthesia Post Note  Patient: Neurosurgeon  Procedure(s) Performed: Procedure(s): Primary CESAREAN SECTION  of baby boy at 25 APGAR 9/9 (N/A)  Anesthesia type: Epidural  Patient location: Mother/Baby  Post pain: Pain level controlled  Post assessment: Post-op Vital signs reviewed  Last Vitals: BP 114/69  Pulse 73  Temp(Src) 36.8 C (Oral)  Resp 18  Ht 5\' 7"  (1.702 m)  Wt 190 lb (86.183 kg)  BMI 29.75 kg/m2  SpO2 95%  LMP 12/29/2011  Post vital signs: Reviewed  Level of consciousness: awake  Complications: No apparent anesthesia complications

## 2012-09-30 NOTE — Lactation Note (Signed)
This note was copied from the chart of Alicia [facility]. Lactation Consultation Note: Initial visit with mom. She reports that baby is nursing well but for 5 minutes at a time. Encouraged to unwrap, undress baby and try to get at least 10 minutes if possible. Baby sucking on pacifier at this time with grandmother. Encouraged not to use pacifier to put baby to breast when she sees feeding cues. Basic teaching reviewed with mom.Use football hold and baby latched well but wants to slide to tip of nipple. Encouraged to get deep latch to prevent sore nipples.Bf brochure given to mom with resources for support after DC. No questions at present. To call for assist prn.  Patient Name: [name] [id number] Date: 09/30/2012 Reason for consult: Initial assessment   Maternal Data Formula Feeding for Exclusion: No Has patient been taught Hand Expression?: Yes Does the patient have breastfeeding experience prior to this delivery?: No  Feeding Feeding Type: Breast Milk Length of feed: 10 min  LATCH Score/Interventions Latch: Grasps breast easily, tongue down, lips flanged, rhythmical sucking.  Audible Swallowing: A few with stimulation  Type of Nipple: Everted at rest and after stimulation  Comfort (Breast/Nipple): Soft / non-tender     Hold (Positioning): Assistance needed to correctly position infant at breast and maintain latch. Intervention(s): Breastfeeding basics reviewed;Support Pillows;Position options  LATCH Score: 8  Lactation Tools Discussed/Used     Consult Status Consult Status: Follow-up Date: 10/01/12 Follow-up type: In-patient    Pamelia Hoit 09/30/2012, 3:02 PM

## 2012-10-01 ENCOUNTER — Encounter (HOSPITAL_COMMUNITY): Payer: Self-pay | Admitting: Obstetrics and Gynecology

## 2012-10-01 MED ORDER — OXYCODONE-ACETAMINOPHEN 5-325 MG PO TABS: 1.0000 | ORAL_TABLET | ORAL | Status: DC | PRN

## 2012-10-01 MED ORDER — IBUPROFEN 600 MG PO TABS: 600.0000 mg | ORAL_TABLET | Freq: Four times a day (QID) | ORAL | Status: DC

## 2012-10-01 NOTE — Discharge Summary (Signed)
Obstetric Discharge Summary Reason for Admission: onset of labor Prenatal Procedures: none Intrapartum Procedures: cesarean: low cervical, transverse Postpartum Procedures: none Complications-Operative and Postpartum: none Hemoglobin  Date Value Range Status  09/30/2012 11.1* 12.0 - 15.0 g/dL Final     HCT  Date Value Range Status  09/30/2012 32.3* 36.0 - 46.0 % Final    Physical Exam:  General: alert, cooperative and appears stated age 37: appropriate Uterine Fundus: firm Incision: healing well, no significant drainage, no dehiscence, no significant erythema DVT Evaluation: No evidence of DVT seen on physical exam.  Discharge Diagnoses: Term Pregnancy-delivered  Discharge Information: Date: 10/01/2012 Activity: pelvic rest Diet: routine Medications: Ibuprofen and Percocet Condition: improved Instructions: refer to practice specific booklet Discharge to: home   Newborn Data: Live born female  Birth Weight: 6 lb 11.9 oz (3059 g) APGAR: 9, 9  Home with mother.  Kahla Risdon L 10/01/2012, 8:07 AM

## 2012-10-01 NOTE — Progress Notes (Signed)
Subjective: Postpartum Day 2: Cesarean Delivery Patient reports incisional pain, tolerating PO and no problems voiding.    Objective: Vital signs in last 24 hours: Temp:  [97.3 F (36.3 C)-98.4 F (36.9 C)] 97.3 F (36.3 C) (08/18 0529) Pulse Rate:  [58-72] 66 (08/18 0529) Resp:  [18] 18 (08/18 0529) BP: (94-121)/(48-72) 121/72 mmHg (08/18 0529) SpO2:  [95 %-96 %] 96 % (08/17 1500)  Physical Exam:  General: alert, cooperative and appears stated age 37: appropriate Uterine Fundus: firm Incision: healing well, no significant drainage, no dehiscence, no significant erythema DVT Evaluation: No evidence of DVT seen on physical exam.   Recent Labs  09/29/12 1205 09/30/12 0619  HGB 12.4 11.1*  HCT 36.3 32.3*    Assessment/Plan: Status post Cesarean section. Doing well postoperatively.  Continue current care Discharge home.  Nalaysia Manganiello L 10/01/2012, 8:06 AM

## 2013-12-13 ENCOUNTER — Other Ambulatory Visit: Payer: Self-pay | Admitting: Obstetrics and Gynecology

## 2013-12-16 ENCOUNTER — Encounter (HOSPITAL_COMMUNITY): Payer: Self-pay | Admitting: Obstetrics and Gynecology

## 2013-12-16 LAB — CYTOLOGY - PAP

## 2014-07-04 ENCOUNTER — Encounter (HOSPITAL_COMMUNITY)
Admission: AD | Disposition: A | Discharge status: Home / Self Care | Payer: Self-pay | Source: Ambulatory Visit | Attending: Obstetrics and Gynecology

## 2014-07-04 ENCOUNTER — Inpatient Hospital Stay (HOSPITAL_COMMUNITY): Admission: RE | Admit: 2014-07-04 | Payer: 59 | Source: Ambulatory Visit | Admitting: Obstetrics and Gynecology

## 2014-07-04 ENCOUNTER — Encounter (HOSPITAL_COMMUNITY): Payer: Self-pay | Admitting: Anesthesiology

## 2014-07-04 ENCOUNTER — Encounter (HOSPITAL_COMMUNITY): Payer: Self-pay | Admitting: *Deleted

## 2014-07-04 ENCOUNTER — Inpatient Hospital Stay (HOSPITAL_COMMUNITY): Payer: 59 | Admitting: Anesthesiology

## 2014-07-04 ENCOUNTER — Inpatient Hospital Stay (HOSPITAL_COMMUNITY)
Admission: AD | Admit: 2014-07-04 | Discharge: 2014-07-06 | DRG: 765 | Disposition: A | Discharge status: Home / Self Care | Payer: 59 | Source: Ambulatory Visit | Attending: Obstetrics and Gynecology | Admitting: Obstetrics and Gynecology

## 2014-07-04 DIAGNOSIS — Z3A37 37 weeks gestation of pregnancy: Secondary | ICD-10-CM | POA: Diagnosis present

## 2014-07-04 DIAGNOSIS — O09523 Supervision of elderly multigravida, third trimester: Secondary | ICD-10-CM | POA: Diagnosis not present

## 2014-07-04 DIAGNOSIS — Z87891 Personal history of nicotine dependence: Secondary | ICD-10-CM | POA: Diagnosis not present

## 2014-07-04 DIAGNOSIS — O9989 Other specified diseases and conditions complicating pregnancy, childbirth and the puerperium: Secondary | ICD-10-CM | POA: Diagnosis present

## 2014-07-04 DIAGNOSIS — O133 Gestational [pregnancy-induced] hypertension without significant proteinuria, third trimester: Secondary | ICD-10-CM | POA: Diagnosis present

## 2014-07-04 DIAGNOSIS — O3421 Maternal care for scar from previous cesarean delivery: Secondary | ICD-10-CM | POA: Diagnosis present

## 2014-07-04 DIAGNOSIS — O4103X0 Oligohydramnios, third trimester, not applicable or unspecified: Secondary | ICD-10-CM | POA: Diagnosis present

## 2014-07-04 DIAGNOSIS — Z302 Encounter for sterilization: Secondary | ICD-10-CM

## 2014-07-04 LAB — COMPREHENSIVE METABOLIC PANEL
ALK PHOS: 117 U/L (ref 38–126)
ALT: 11 U/L — ABNORMAL LOW (ref 14–54)
ANION GAP: 7 (ref 5–15)
AST: 18 U/L (ref 15–41)
Albumin: 3.1 g/dL — ABNORMAL LOW (ref 3.5–5.0)
BILIRUBIN TOTAL: 0.2 mg/dL — AB (ref 0.3–1.2)
BUN: 10 mg/dL (ref 6–20)
CHLORIDE: 105 mmol/L (ref 101–111)
CO2: 21 mmol/L — AB (ref 22–32)
Calcium: 8.5 mg/dL — ABNORMAL LOW (ref 8.9–10.3)
Creatinine, Ser: 0.64 mg/dL (ref 0.44–1.00)
Glucose, Bld: 78 mg/dL (ref 65–99)
Potassium: 3.9 mmol/L (ref 3.5–5.1)
SODIUM: 133 mmol/L — AB (ref 135–145)
Total Protein: 6.2 g/dL — ABNORMAL LOW (ref 6.5–8.1)

## 2014-07-04 LAB — URIC ACID: Uric Acid, Serum: 4.6 mg/dL (ref 2.3–6.6)

## 2014-07-04 LAB — CBC
HCT: 33.7 % — ABNORMAL LOW (ref 36.0–46.0)
Hemoglobin: 11.1 g/dL — ABNORMAL LOW (ref 12.0–15.0)
MCH: 27.1 pg (ref 26.0–34.0)
MCHC: 32.9 g/dL (ref 30.0–36.0)
MCV: 82.4 fL (ref 78.0–100.0)
PLATELETS: 227 10*3/uL (ref 150–400)
RBC: 4.09 MIL/uL (ref 3.87–5.11)
RDW: 14.2 % (ref 11.5–15.5)
WBC: 11.5 10*3/uL — AB (ref 4.0–10.5)

## 2014-07-04 LAB — ABO/RH: ABO/RH(D): O POS

## 2014-07-04 LAB — TYPE AND SCREEN
ABO/RH(D): O POS
ANTIBODY SCREEN: NEGATIVE

## 2014-07-04 SURGERY — Surgical Case
Anesthesia: Spinal | Site: Abdomen

## 2014-07-04 SURGERY — Surgical Case
Anesthesia: Epidural | Laterality: Bilateral

## 2014-07-04 MED ORDER — FENTANYL CITRATE (PF) 100 MCG/2ML IJ SOLN
25.0000 ug | INTRAMUSCULAR | Status: DC | PRN
  Administered 2014-07-04: 50 ug via INTRAVENOUS

## 2014-07-04 MED ORDER — SODIUM CHLORIDE 0.9 % IJ SOLN
INTRAMUSCULAR | Status: DC | PRN
  Administered 2014-07-04: 10 mL via INTRAVENOUS

## 2014-07-04 MED ORDER — FENTANYL CITRATE (PF) 100 MCG/2ML IJ SOLN
INTRAMUSCULAR | Status: AC
  Filled 2014-07-04: qty 2

## 2014-07-04 MED ORDER — KETOROLAC TROMETHAMINE 30 MG/ML IJ SOLN
30.0000 mg | Freq: Four times a day (QID) | INTRAMUSCULAR | Status: DC | PRN
Start: 2014-07-04 — End: 2014-07-04

## 2014-07-04 MED ORDER — ACETAMINOPHEN 10 MG/ML IV SOLN
1000.0000 mg | Freq: Once | INTRAVENOUS | Status: AC
  Administered 2014-07-04: 1000 mg via INTRAVENOUS
  Filled 2014-07-04: qty 100

## 2014-07-04 MED ORDER — IBUPROFEN 600 MG PO TABS
600.0000 mg | ORAL_TABLET | Freq: Four times a day (QID) | ORAL | Status: DC
  Administered 2014-07-04 – 2014-07-06 (×7): 600 mg via ORAL
  Filled 2014-07-04 (×7): qty 1

## 2014-07-04 MED ORDER — BUPIVACAINE LIPOSOME 1.3 % IJ SUSP
INTRAMUSCULAR | Status: DC | PRN
  Administered 2014-07-04: 20 mL

## 2014-07-04 MED ORDER — MEPERIDINE HCL 25 MG/ML IJ SOLN: 6.2500 mg | INTRAMUSCULAR | Status: DC | PRN

## 2014-07-04 MED ORDER — DIPHENHYDRAMINE HCL 25 MG PO CAPS: 25.0000 mg | ORAL_CAPSULE | Freq: Four times a day (QID) | ORAL | Status: DC | PRN

## 2014-07-04 MED ORDER — NALBUPHINE HCL 10 MG/ML IJ SOLN: 5.0000 mg | INTRAMUSCULAR | Status: DC | PRN

## 2014-07-04 MED ORDER — LANOLIN HYDROUS EX OINT: 1.0000 "application " | TOPICAL_OINTMENT | CUTANEOUS | Status: DC | PRN

## 2014-07-04 MED ORDER — ONDANSETRON HCL 4 MG/2ML IJ SOLN
INTRAMUSCULAR | Status: DC | PRN
  Administered 2014-07-04: 4 mg via INTRAVENOUS

## 2014-07-04 MED ORDER — PNEUMOCOCCAL VAC POLYVALENT 25 MCG/0.5ML IJ INJ
0.5000 mL | INJECTION | INTRAMUSCULAR | Status: AC
  Administered 2014-07-05: 0.5 mL via INTRAMUSCULAR
  Filled 2014-07-04: qty 0.5

## 2014-07-04 MED ORDER — TETANUS-DIPHTH-ACELL PERTUSSIS 5-2.5-18.5 LF-MCG/0.5 IM SUSP: 0.5000 mL | Freq: Once | INTRAMUSCULAR | Status: DC

## 2014-07-04 MED ORDER — GENTAMICIN SULFATE 40 MG/ML IJ SOLN
INTRAVENOUS | Status: AC
  Administered 2014-07-04: 114.75 mL via INTRAVENOUS
  Filled 2014-07-04: qty 8.75

## 2014-07-04 MED ORDER — DEXAMETHASONE SODIUM PHOSPHATE 4 MG/ML IJ SOLN
INTRAMUSCULAR | Status: AC
  Filled 2014-07-04: qty 1

## 2014-07-04 MED ORDER — ACETAMINOPHEN 325 MG PO TABS: 650.0000 mg | ORAL_TABLET | ORAL | Status: DC | PRN

## 2014-07-04 MED ORDER — LACTATED RINGERS IV SOLN
INTRAVENOUS | Status: DC | PRN
  Administered 2014-07-04: 16:00:00 via INTRAVENOUS

## 2014-07-04 MED ORDER — SENNOSIDES-DOCUSATE SODIUM 8.6-50 MG PO TABS
2.0000 | ORAL_TABLET | ORAL | Status: DC
  Administered 2014-07-04 – 2014-07-06 (×2): 2 via ORAL
  Filled 2014-07-04 (×2): qty 2

## 2014-07-04 MED ORDER — NALOXONE HCL 0.4 MG/ML IJ SOLN: 0.4000 mg | INTRAMUSCULAR | Status: DC | PRN

## 2014-07-04 MED ORDER — SCOPOLAMINE 1 MG/3DAYS TD PT72: 1.0000 | MEDICATED_PATCH | Freq: Once | TRANSDERMAL | Status: DC

## 2014-07-04 MED ORDER — MORPHINE SULFATE (PF) 0.5 MG/ML IJ SOLN
INTRAMUSCULAR | Status: DC | PRN
  Administered 2014-07-04: .2 mg via INTRATHECAL

## 2014-07-04 MED ORDER — DIPHENHYDRAMINE HCL 50 MG/ML IJ SOLN: 12.5000 mg | INTRAMUSCULAR | Status: DC | PRN

## 2014-07-04 MED ORDER — KETOROLAC TROMETHAMINE 30 MG/ML IJ SOLN
INTRAMUSCULAR | Status: AC
  Filled 2014-07-04: qty 1

## 2014-07-04 MED ORDER — FAMOTIDINE IN NACL 20-0.9 MG/50ML-% IV SOLN
20.0000 mg | Freq: Once | INTRAVENOUS | Status: AC
  Administered 2014-07-04: 20 mg via INTRAVENOUS
  Filled 2014-07-04: qty 50

## 2014-07-04 MED ORDER — PHENYLEPHRINE 8 MG IN D5W 100 ML (0.08MG/ML) PREMIX OPTIME
INJECTION | INTRAVENOUS | Status: DC | PRN
  Administered 2014-07-04: 60 ug/min via INTRAVENOUS

## 2014-07-04 MED ORDER — ONDANSETRON HCL 4 MG/2ML IJ SOLN
INTRAMUSCULAR | Status: AC
  Filled 2014-07-04: qty 2

## 2014-07-04 MED ORDER — MORPHINE SULFATE 0.5 MG/ML IJ SOLN
INTRAMUSCULAR | Status: AC
  Filled 2014-07-04: qty 10

## 2014-07-04 MED ORDER — PRENATAL MULTIVITAMIN CH
1.0000 | ORAL_TABLET | Freq: Every day | ORAL | Status: DC
  Administered 2014-07-05 – 2014-07-06 (×2): 1 via ORAL
  Filled 2014-07-04 (×2): qty 1

## 2014-07-04 MED ORDER — KETOROLAC TROMETHAMINE 30 MG/ML IJ SOLN: 30.0000 mg | Freq: Four times a day (QID) | INTRAMUSCULAR | Status: DC | PRN

## 2014-07-04 MED ORDER — DIBUCAINE 1 % RE OINT: 1.0000 "application " | TOPICAL_OINTMENT | RECTAL | Status: DC | PRN

## 2014-07-04 MED ORDER — SIMETHICONE 80 MG PO CHEW
80.0000 mg | CHEWABLE_TABLET | ORAL | Status: DC
  Administered 2014-07-04 – 2014-07-06 (×2): 80 mg via ORAL
  Filled 2014-07-04 (×3): qty 1

## 2014-07-04 MED ORDER — SODIUM CHLORIDE 0.9 % IJ SOLN: 3.0000 mL | INTRAMUSCULAR | Status: DC | PRN

## 2014-07-04 MED ORDER — OXYTOCIN 40 UNITS IN LACTATED RINGERS INFUSION - SIMPLE MED: 62.5000 mL/h | INTRAVENOUS | Status: AC

## 2014-07-04 MED ORDER — NALBUPHINE HCL 10 MG/ML IJ SOLN: 5.0000 mg | Freq: Once | INTRAMUSCULAR | Status: AC | PRN

## 2014-07-04 MED ORDER — LACTATED RINGERS IV SOLN
INTRAVENOUS | Status: DC
  Administered 2014-07-05: 03:00:00 via INTRAVENOUS

## 2014-07-04 MED ORDER — MENTHOL 3 MG MT LOZG: 1.0000 | LOZENGE | OROMUCOSAL | Status: DC | PRN

## 2014-07-04 MED ORDER — PHENYLEPHRINE 8 MG IN D5W 100 ML (0.08MG/ML) PREMIX OPTIME
INJECTION | INTRAVENOUS | Status: AC
  Filled 2014-07-04: qty 100

## 2014-07-04 MED ORDER — SODIUM CHLORIDE 0.9 % IJ SOLN
INTRAMUSCULAR | Status: AC
  Filled 2014-07-04: qty 10

## 2014-07-04 MED ORDER — SIMETHICONE 80 MG PO CHEW
80.0000 mg | CHEWABLE_TABLET | ORAL | Status: DC | PRN
Start: 2014-07-04 — End: 2014-07-06

## 2014-07-04 MED ORDER — BUPIVACAINE LIPOSOME 1.3 % IJ SUSP
20.0000 mL | Freq: Once | INTRAMUSCULAR | Status: DC
  Filled 2014-07-04: qty 20

## 2014-07-04 MED ORDER — MAGNESIUM HYDROXIDE 400 MG/5ML PO SUSP: 30.0000 mL | ORAL | Status: DC | PRN

## 2014-07-04 MED ORDER — FENTANYL CITRATE (PF) 100 MCG/2ML IJ SOLN
INTRAMUSCULAR | Status: AC
  Administered 2014-07-04: 50 ug via INTRAVENOUS
  Filled 2014-07-04: qty 2

## 2014-07-04 MED ORDER — OXYCODONE-ACETAMINOPHEN 5-325 MG PO TABS
2.0000 | ORAL_TABLET | ORAL | Status: DC | PRN
  Administered 2014-07-04 – 2014-07-06 (×9): 2 via ORAL
  Filled 2014-07-04 (×9): qty 2

## 2014-07-04 MED ORDER — OXYTOCIN 10 UNIT/ML IJ SOLN
INTRAMUSCULAR | Status: AC
  Filled 2014-07-04: qty 4

## 2014-07-04 MED ORDER — CITRIC ACID-SODIUM CITRATE 334-500 MG/5ML PO SOLN
30.0000 mL | Freq: Once | ORAL | Status: AC
  Administered 2014-07-04: 30 mL via ORAL
  Filled 2014-07-04: qty 15

## 2014-07-04 MED ORDER — WITCH HAZEL-GLYCERIN EX PADS: 1.0000 "application " | MEDICATED_PAD | CUTANEOUS | Status: DC | PRN

## 2014-07-04 MED ORDER — NALOXONE HCL 1 MG/ML IJ SOLN: 1.0000 ug/kg/h | INTRAVENOUS | Status: DC | PRN

## 2014-07-04 MED ORDER — ONDANSETRON HCL 4 MG/2ML IJ SOLN
4.0000 mg | Freq: Three times a day (TID) | INTRAMUSCULAR | Status: DC | PRN
Start: 2014-07-04 — End: 2014-07-06

## 2014-07-04 MED ORDER — BUPIVACAINE IN DEXTROSE 0.75-8.25 % IT SOLN
INTRATHECAL | Status: DC | PRN
  Administered 2014-07-04: 1.6 mL via INTRATHECAL

## 2014-07-04 MED ORDER — KETOROLAC TROMETHAMINE 30 MG/ML IJ SOLN
30.0000 mg | Freq: Once | INTRAMUSCULAR | Status: AC
  Administered 2014-07-04: 30 mg via INTRAMUSCULAR

## 2014-07-04 MED ORDER — PROMETHAZINE HCL 25 MG PO TABS
25.0000 mg | ORAL_TABLET | Freq: Three times a day (TID) | ORAL | Status: DC | PRN
  Filled 2014-07-04: qty 1

## 2014-07-04 MED ORDER — OXYTOCIN 10 UNIT/ML IJ SOLN
40.0000 [IU] | INTRAVENOUS | Status: DC | PRN
  Administered 2014-07-04: 40 [IU] via INTRAVENOUS

## 2014-07-04 MED ORDER — SIMETHICONE 80 MG PO CHEW
80.0000 mg | CHEWABLE_TABLET | Freq: Three times a day (TID) | ORAL | Status: DC
  Administered 2014-07-05 – 2014-07-06 (×5): 80 mg via ORAL
  Filled 2014-07-04 (×4): qty 1

## 2014-07-04 MED ORDER — DEXAMETHASONE SODIUM PHOSPHATE 4 MG/ML IJ SOLN
INTRAMUSCULAR | Status: DC | PRN
  Administered 2014-07-04: 4 mg via INTRAVENOUS

## 2014-07-04 MED ORDER — ZOLPIDEM TARTRATE 5 MG PO TABS: 5.0000 mg | ORAL_TABLET | Freq: Every evening | ORAL | Status: DC | PRN

## 2014-07-04 MED ORDER — SCOPOLAMINE 1 MG/3DAYS TD PT72
MEDICATED_PATCH | TRANSDERMAL | Status: AC
  Filled 2014-07-04: qty 1

## 2014-07-04 MED ORDER — FENTANYL CITRATE (PF) 100 MCG/2ML IJ SOLN
INTRAMUSCULAR | Status: DC | PRN
  Administered 2014-07-04: 20 ug via INTRATHECAL

## 2014-07-04 MED ORDER — 0.9 % SODIUM CHLORIDE (POUR BTL) OPTIME
TOPICAL | Status: DC | PRN
  Administered 2014-07-04: 1000 mL

## 2014-07-04 MED ORDER — DIPHENHYDRAMINE HCL 25 MG PO CAPS: 25.0000 mg | ORAL_CAPSULE | ORAL | Status: DC | PRN

## 2014-07-04 MED ORDER — SCOPOLAMINE 1 MG/3DAYS TD PT72
MEDICATED_PATCH | TRANSDERMAL | Status: DC | PRN
  Administered 2014-07-04: 1 via TRANSDERMAL

## 2014-07-04 SURGICAL SUPPLY — 31 items
APL SKNCLS STERI-STRIP NONHPOA (GAUZE/BANDAGES/DRESSINGS) ×1
BENZOIN TINCTURE PRP APPL 2/3 (GAUZE/BANDAGES/DRESSINGS) ×1 IMPLANT
CLAMP CORD UMBIL (MISCELLANEOUS) ×1 IMPLANT
CLOTH BEACON ORANGE TIMEOUT ST (SAFETY) ×2 IMPLANT
CONTAINER PREFILL 10% NBF 15ML (MISCELLANEOUS) ×2 IMPLANT
DRAPE SHEET LG 3/4 BI-LAMINATE (DRAPES) ×1 IMPLANT
DRSG OPSITE POSTOP 4X10 (GAUZE/BANDAGES/DRESSINGS) ×2 IMPLANT
DURAPREP 26ML APPLICATOR (WOUND CARE) ×2 IMPLANT
ELECT REM PT RETURN 9FT ADLT (ELECTROSURGICAL) ×2
ELECTRODE REM PT RTRN 9FT ADLT (ELECTROSURGICAL) ×1 IMPLANT
GLOVE BIO SURGEON STRL SZ7.5 (GLOVE) ×2 IMPLANT
GOWN STRL REUS W/TWL LRG LVL3 (GOWN DISPOSABLE) ×4 IMPLANT
KIT ABG SYR 3ML LUER SLIP (SYRINGE) ×2 IMPLANT
NDL HYPO 21X1.5 SAFETY (NEEDLE) ×1 IMPLANT
NDL HYPO 25X5/8 SAFETYGLIDE (NEEDLE) ×1 IMPLANT
NEEDLE HYPO 21X1.5 SAFETY (NEEDLE) ×2 IMPLANT
NEEDLE HYPO 25X5/8 SAFETYGLIDE (NEEDLE) ×2 IMPLANT
NS IRRIG 1000ML POUR BTL (IV SOLUTION) ×2 IMPLANT
PACK C SECTION WH (CUSTOM PROCEDURE TRAY) ×2 IMPLANT
PAD OB MATERNITY 4.3X12.25 (PERSONAL CARE ITEMS) ×2 IMPLANT
STRIP CLOSURE SKIN 1/2X4 (GAUZE/BANDAGES/DRESSINGS) ×1 IMPLANT
SUT MNCRL 0 VIOLET CTX 36 (SUTURE) ×4 IMPLANT
SUT MONOCRYL 0 CTX 36 (SUTURE) ×4
SUT PDS AB 0 CTX 60 (SUTURE) ×2 IMPLANT
SUT PLAIN 0 NONE (SUTURE) ×1 IMPLANT
SUT PLAIN 2 0 (SUTURE) ×2
SUT PLAIN ABS 2-0 CT1 27XMFL (SUTURE) IMPLANT
SUT VIC AB 4-0 KS 27 (SUTURE) ×2 IMPLANT
SYR 30ML LL (SYRINGE) ×2 IMPLANT
TOWEL OR 17X24 6PK STRL BLUE (TOWEL DISPOSABLE) ×2 IMPLANT
TRAY FOLEY CATH SILVER 14FR (SET/KITS/TRAYS/PACK) ×2 IMPLANT

## 2014-07-04 NOTE — H&P (Signed)
Lajean Manesrica Formica is a 39 y.o. female presenting with U/S on 07/01/14 showing AFI @ 20%, vtx, EFW 5#11oz (20%) with BPP 8/8. Today after MBR and increased fluids AFI is 9%, BPP 8/8 and umbilical dopplers 2.78 (normal is <2.8). 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 This pregnancy lilmited to Vicodin 5/300 no more than 30/month for shoulder/back pain. 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 GBBS negative. Normal Panorama.  Maternal Medical History:  Fetal activity: Perceived fetal activity is normal.      OB History    Gravida Para Term Preterm AB TAB SAB Ectopic Multiple Living   2 1 1  0 1 1 0 0 0 1     No past medical history on file. Past Surgical History  Procedure Laterality Date  . Bowel resection    . Appendectomy    . Cesarean section N/A 09/29/2012    Procedure: Primary CESAREAN SECTION  of baby boy at 1935 APGAR 9/9;  Surgeon: Turner Danielsavid C Lowe, MD;  Location: [facility];  Service: Obstetrics;  Laterality: N/A;   Family History: family history is not on file. Social History:  reports that she has quit smoking. She does not have any smokeless tobacco history on file. She reports that she does not drink alcohol or use illicit drugs.   Prenatal Transfer Tool  Maternal Diabetes: No Genetic Screening: Normal Maternal Ultrasounds/Referrals: Normal Fetal Ultrasounds or other Referrals:  None Maternal Substance Abuse:  Yes:  Type: Other: Vicodin Significant Maternal Medications:  Meds include: Other: vicodin #30/month for shoulder/back pain Significant Maternal Lab Results:  None Other Comments:  None  Review of Systems  HENT:       Headache yesterday  Gastrointestinal: Negative for abdominal pain.      Blood pressure 138/80, pulse 84, temperature 97.9 F (36.6 C), temperature source Oral, resp. rate 20, SpO2 99 %, unknown if currently breastfeeding. Maternal Exam:  Abdomen: Patient reports no  abdominal tenderness. Fetal presentation: vertex     Physical Exam  Cardiovascular: Normal rate and regular rhythm.   Respiratory: Effort normal and breath sounds normal.  GI: Soft. There is no tenderness.  Neurological: She has normal reflexes.    Prenatal labs: ABO, Rh:   Antibody:   Rubella:   RPR:    HBsAg:    HIV:    GBS:     Assessment/Plan: 39 yo G3P1 with previous cesarean section desires repeat and permanent sterilization BP rising and AFI decreasing with umbilical dopplers at upper normal Will obtain labs and monitor Probable repeat cesarean section with BTL today    Marketta Valadez II,Anora Schwenke E 07/04/2014, 12:14 PM

## 2014-07-04 NOTE — Anesthesia Procedure Notes (Signed)
Spinal Patient location during procedure: OR Staffing Anesthesiologist: Cadee Agro, CHRIS Preanesthetic Checklist Completed: patient identified, surgical consent, pre-op evaluation, timeout performed, IV checked, risks and benefits discussed and monitors and equipment checked Spinal Block Patient position: sitting Prep: site prepped and draped and DuraPrep Patient monitoring: heart rate, cardiac monitor, continuous pulse ox and blood pressure Approach: midline Location: L3-4 Injection technique: single-shot Needle Needle type: Pencan  Needle gauge: 24 G Needle length: 10 cm Assessment Sensory level: T4   

## 2014-07-04 NOTE — Progress Notes (Signed)
BP in office 150/80.

## 2014-07-04 NOTE — Anesthesia Preprocedure Evaluation (Addendum)
Anesthesia Evaluation  Patient identified by MRN, date of birth, ID band Patient awake    Reviewed: Allergy & Precautions, NPO status , Patient's Chart, lab work & pertinent test results  History of Anesthesia Complications Negative for: history of anesthetic complications  Airway Mallampati: II  TM Distance: >3 FB Neck ROM: Full    Dental  (+) Teeth Intact   Pulmonary neg shortness of breath, neg sleep apnea, neg COPDneg recent URI, Current Smoker,  breath sounds clear to auscultation        Cardiovascular negative cardio ROS  Rhythm:Regular     Neuro/Psych negative neurological ROS  negative psych ROS   GI/Hepatic negative GI ROS, Neg liver ROS,   Endo/Other  negative endocrine ROS  Renal/GU negative Renal ROS     Musculoskeletal   Abdominal   Peds  Hematology  (+) anemia ,   Anesthesia Other Findings   Reproductive/Obstetrics (+) Pregnancy                            Anesthesia Physical Anesthesia Plan  ASA: II  Anesthesia Plan: Epidural   Post-op Pain Management:    Induction:   Airway Management Planned:   Additional Equipment:   Intra-op Plan:   Post-operative Plan:   Informed Consent: I have reviewed the patients History and Physical, chart, labs and discussed the procedure including the risks, benefits and alternatives for the proposed anesthesia with the patient or authorized representative who has indicated his/her understanding and acceptance.   Dental advisory given  Plan Discussed with: Anesthesiologist, CRNA and Surgeon  Anesthesia Plan Comments:         Anesthesia Quick Evaluation

## 2014-07-04 NOTE — Brief Op Note (Signed)
07/04/2014  5:02 PM  PATIENT:  [name]  39 y.o. female  PRE-OPERATIVE DIAGNOSIS:  Oligohydramnios  POST-OPERATIVE DIAGNOSIS:  Oligohydramnios  PROCEDURE:  Procedure(s): CESAREAN SECTION (N/A)  SURGEON:  Surgeon(s) and Role:    * Harold HedgeJames Marcayla Budge, MD - Primary  PHYSICIAN ASSISTANT:   ASSISTANTS: none   ANESTHESIA:   spinal  EBL:  Total I/O In: 900 [I.V.:900] Out: 900 [Urine:200; Blood:700]  BLOOD ADMINISTERED:none  DRAINS: Urinary Catheter (Foley)   LOCAL MEDICATIONS USED:  OTHER Exparel 30ml diluted in saline  SPECIMEN:  Source of Specimen:  placenta, bilateral fallopian tube segments  DISPOSITION OF SPECIMEN:  PATHOLOGY  COUNTS:  YES  TOURNIQUET:  * No tourniquets in log *  DICTATION: .Other Dictation: Dictation Number [id number]  PLAN OF CARE: Admit to inpatient   PATIENT DISPOSITION:  PACU - hemodynamically stable.   Delay start of Pharmacological VTE agent (>24hrs) due to surgical blood loss or risk of bleeding: not applicable

## 2014-07-04 NOTE — Transfer of Care (Signed)
Immediate Anesthesia Transfer of Care Note  Patient: Alicia ManesErica Paul  Procedure(s) Performed: Procedure(s) with comments: REPEAT CESAREAN SECTION WITH BILATERAL TUBAL LIGATION (Bilateral) - Repead edc 07/20/14  Patient Location: PACU  Anesthesia Type:Spinal  Level of Consciousness: awake, alert  and oriented  Airway & Oxygen Therapy: Patient Spontanous Breathing  Post-op Assessment: Report given to RN and Post -op Vital signs reviewed and stable  Post vital signs: Reviewed and stable  Last Vitals:  Filed Vitals:   07/04/14 1522  BP: 137/74  Pulse: 77  Temp: 36.7 C  Resp: 20    Complications: No apparent anesthesia complications

## 2014-07-04 NOTE — MAU Note (Signed)
Pt states she was seen Dr. Henderson Cloudomblin today in office for elevated BP and amniotic fluid rechecked.  Pt states her amniotic fluid had decreased from 18 to 9 percent in 3 days.  Good fetal movement.  Denies vaginal bleeding or abnormal discharge.

## 2014-07-04 NOTE — Progress Notes (Signed)
Reviewed with patient and her mother the decreasing AFI and borderline dopplers Rec: delivery. D/W cesarean section with bilateral tubal ligation. D/ W risks including infection, organ damage, bleeding / transfusion-HIV/Hep, DVT/PE, pneumonia. Permanence , failure rate, increased ectopic rate, alternatives reviewed. All questions answered.  Patient states she understands and agrees.

## 2014-07-04 NOTE — MAU Note (Signed)
Pt. Urine in lab 

## 2014-07-04 NOTE — Anesthesia Postprocedure Evaluation (Signed)
  Anesthesia Post-op Note  Patient: [name]  Procedure(s) Performed: Procedure(s) with comments: REPEAT CESAREAN SECTION WITH BILATERAL TUBAL LIGATION (Bilateral) - Repead edc 07/20/14  Patient Location: PACU  Anesthesia Type:Spinal  Level of Consciousness: awake  Airway and Oxygen Therapy: Patient Spontanous Breathing  Post-op Pain: mild  Post-op Assessment: Post-op Vital signs reviewed, Patient's Cardiovascular Status Stable, Respiratory Function Stable, Patent Airway, No signs of Nausea or vomiting and Pain level controlled  Post-op Vital Signs: Reviewed and stable  Last Vitals:  Filed Vitals:   07/04/14 1841  BP: 127/64  Pulse: 51  Temp: 36.8 C  Resp: 18    Complications: No apparent anesthesia complications

## 2014-07-05 LAB — CBC
HEMATOCRIT: 26.8 % — AB (ref 36.0–46.0)
Hemoglobin: 8.9 g/dL — ABNORMAL LOW (ref 12.0–15.0)
MCH: 27.3 pg (ref 26.0–34.0)
MCHC: 33.2 g/dL (ref 30.0–36.0)
MCV: 82.2 fL (ref 78.0–100.0)
Platelets: 191 10*3/uL (ref 150–400)
RBC: 3.26 MIL/uL — ABNORMAL LOW (ref 3.87–5.11)
RDW: 14.2 % (ref 11.5–15.5)
WBC: 10.9 10*3/uL — AB (ref 4.0–10.5)

## 2014-07-05 NOTE — Anesthesia Postprocedure Evaluation (Signed)
  Anesthesia Post-op Note  Patient: [name]  Procedure(s) Performed: Procedure(s): CESAREAN SECTION (N/A)  Patient Location: Mother/Baby  Anesthesia Type:Spinal  Level of Consciousness: awake and alert   Airway and Oxygen Therapy: Patient Spontanous Breathing  Post-op Pain: mild  Post-op Assessment: Post-op Vital signs reviewed, Patient's Cardiovascular Status Stable, Respiratory Function Stable, No signs of Nausea or vomiting, Pain level controlled, No headache, No residual numbness and No residual motor weakness  Post-op Vital Signs: Reviewed  Last Vitals:  Filed Vitals:   07/05/14 0615  BP: 108/55  Pulse: 54  Temp: 36.7 C  Resp: 18    Complications: No apparent anesthesia complications

## 2014-07-05 NOTE — Op Note (Signed)
[name], [name]           ACCOUNT NO.:  [id number]  MEDICAL RECORD NO.:  [id number]  LOCATION:  9106                          FACILITY:  [facility]  PHYSICIAN:  Guy Sandifer. Henderson Cloud, M.D. DATE OF BIRTH:  [birth year]  DATE OF PROCEDURE:  07/04/2014 DATE OF DISCHARGE:                              OPERATIVE REPORT   PREOPERATIVE DIAGNOSES: 1. Oligohydramnios. 2. Pregnancy-induced hypertension.  POSTOPERATIVE DIAGNOSES: 1. Oligohydramnios. 2. Pregnancy-induced hypertension.  PROCEDURES: 1. Repeat low-transverse cesarean section. 2. Bilateral tubal ligation.  SURGEON:  Guy Sandifer. Henderson Cloud, M.D.  ANESTHESIA:  Spinal.  ESTIMATED BLOOD LOSS:  500 mL.  FINDINGS:  A viable female infant.  Apgars of 8 and 9 at one and five minutes respectively.  Birth weight and arterial cord pH pending.  SPECIMENS: 1. Bilateral fallopian tube segments. 2. Placenta to Pathology.  INDICATIONS AND CONSENT:  This patient is a 39 year old, G3, P1 at 70 and 5/7th weeks.  She is noted to have decreasing fluid with oligohydramnios borderline Doppler findings and elevated blood pressures as outlined in the history and physical.  She has a previous cesarean section and desires repeat.  Delivery is recommended.  She also requests bilateral tubal ligation.  Alternate methods have been reviewed. Potential risks and complications have been discussed including, but not limited to, infection, organ damage, bleeding, requiring transfusion of blood products with HIV and hepatitis acquisition, DVT, PE, and pneumonia.  Permanence of tubal ligation, failure rate, and increased ectopic risk are also reviewed.  All questions were answered and consent was signed on the chart.  DESCRIPTION OF PROCEDURE:  The patient was taken to the operating room, where she was identified and spinal anesthetic was placed.  She was placed in the dorsal spine position with a 15-degree left lateral wedge. Foley catheter was then placed and  she was prepped per [facility] protocol.  She was draped in a sterile fashion.  A time-out was undertaken.  After testing for adequate spinal anesthesia, skin was entered through a Pfannenstiel incision taking out the old scar on the way in.  Dissection was carried out in layers of the peritoneum, which was taken superiorly and inferiorly.  The vesicouterine peritoneum was taken down cephalolaterally.  Bladder flap was developed.  The bladder blade was placed.  Uterus was incised in a low-transverse manner.  The uterine cavity was entered bluntly with a hemostat.  Essentially, no amniotic fluid was noted from the incision.  The incision was extended cephalolaterally with fingers.  Baby was then delivered from the vertex position without difficulty.  Cord was clamped and cut.  The baby was handed to awaiting pediatrics team with a good cry and tone.  Placenta was manually delivered and sent to Pathology.  Uterine cavity was inspected and was clean.  Uterus was closed in 2 running locking imbricating layers of 0 Monocryl suture, which achieves good hemostasis. Uterus was exteriorized.  Excess blood and clot was removed in the anterior and posterior cul-de-sacs.  Ovaries were normal bilaterally. Fallopian tubes were normal bilaterally.  Left fallopian tube was identified from cornu to fimbria.  It was grasped in its mid ampullary portion with a Babcock clamp.  A knuckle of tube was then doubly ligated with  2 free ties of plain suture.  The intervening knuckle was sharply resected with Metzenbaum scissors.  Cautery was used to assure hemostasis.  Similar procedure was carried out on the right side.  The uterus was returned to the abdomen.  Irrigation was carried out and all returns were clear.  Anterior peritoneum was closed in a running fashion with 0 Monocryl suture, which was also used to reapproximate the pyramidalis muscle in the midline.  Anterior rectus fascia was closed in a  running fashion with a 0 looped PDS suture.  Exparel was then injected both subfascially and subcutaneously.  Subcutaneous layer was closed with interrupted plain suture and the skin was closed with Vicryl on a Keith needle.  Steri-Strips with benzoin was then applied and a dressing was applied.  All counts were correct and the patient was taken to recovery room in stable condition.     Guy SandiferJames E. Henderson Cloudomblin, M.D.     JET/MEDQ  D:  07/04/2014  T:  07/05/2014  Job:  [id number]

## 2014-07-05 NOTE — Progress Notes (Signed)
Subjective: Postpartum Day 1: Cesarean Delivery Patient reports tolerating PO and + flatus.    Objective: Vital signs in last 24 hours: Temp:  [97.7 F (36.5 C)-98.5 F (36.9 C)] 98 F (36.7 C) (05/21 0229) Pulse Rate:  [51-96] 57 (05/20 2245) Resp:  [14-20] 18 (05/20 2245) BP: (102-142)/(47-84) 102/47 mmHg (05/20 2245) SpO2:  [96 %-100 %] 96 % (05/21 0229) Weight:  [186 lb 6.4 oz (84.55 kg)] 186 lb 6.4 oz (84.55 kg) (05/20 1213)  Physical Exam:  General: alert, cooperative and no distress Lochia: appropriate Uterine Fundus: firm Incision: healing well DVT Evaluation: No evidence of DVT seen on physical exam.   Recent Labs  07/04/14 1215  HGB 11.1*  HCT 33.7*    Assessment/Plan: Status post Cesarean section. Doing well postoperatively.  Continue current care.  Sunday Klos II,Tyara Dassow E 07/05/2014, 5:31 AM

## 2014-07-06 MED ORDER — OXYCODONE-ACETAMINOPHEN 5-325 MG PO TABS: 1.0000 | ORAL_TABLET | Freq: Four times a day (QID) | ORAL | Status: DC | PRN

## 2014-07-06 NOTE — Discharge Instructions (Signed)
No vaginal entry No operation of automobiles No vaginal entry

## 2014-07-06 NOTE — Progress Notes (Signed)
Subjective: Postpartum Day 2: Cesarean Delivery Patient reports tolerating PO, + flatus and no problems voiding.    Objective: Vital signs in last 24 hours: Temp:  [97.7 F (36.5 C)-98.2 F (36.8 C)] 98.2 F (36.8 C) (05/22 0547) Pulse Rate:  [55-60] 55 (05/22 0547) Resp:  [18-20] 18 (05/22 0547) BP: (111-121)/(53-65) 120/62 mmHg (05/22 0547) SpO2:  [97 %-99 %] 99 % (05/21 1453)  Physical Exam:  General: alert, cooperative and no distress Lochia: appropriate Uterine Fundus: firm Incision: healing well DVT Evaluation: No evidence of DVT seen on physical exam.   Recent Labs  07/04/14 1215 07/05/14 0605  HGB 11.1* 8.9*  HCT 33.7* 26.8*    Assessment/Plan: Status post Cesarean section. Doing well postoperatively.  Discharge home with standard precautions and return to clinic in 4-6 weeks Father to supervise Percocet at home.  Kylie Gros II,Isiaah Cuervo E 07/06/2014, 7:04 AM

## 2014-07-06 NOTE — Discharge Summary (Signed)
Obstetric Discharge Summary Reason for Admission: oligohydramnios Prenatal Procedures: none Intrapartum Procedures: cesarean: low cervical, transverse and tubal ligation Postpartum Procedures: none Complications-Operative and Postpartum: none HEMOGLOBIN  Date Value Ref Range Status  07/05/2014 8.9* 12.0 - 15.0 g/dL Final   HCT  Date Value Ref Range Status  07/05/2014 26.8* 36.0 - 46.0 % Final    Physical Exam:  General: alert, cooperative and no distress Lochia: appropriate Uterine Fundus: firm Incision: healing well DVT Evaluation: No evidence of DVT seen on physical exam.  Discharge Diagnoses: Term Pregnancy-delivered  Discharge Information: Date: 07/06/2014 Activity: pelvic rest Diet: routine Medications: PNV, Ibuprofen and Percocet Condition: stable Instructions: refer to practice specific booklet Discharge to: home   Newborn Data: Live born female  Birth Weight: 6 lb 3.8 oz (2830 g) APGAR: 8, 9  Home with mother.  Monte Bronder II,Cristina Ceniceros E 07/06/2014, 7:07 AM2

## 2014-07-09 ENCOUNTER — Encounter (HOSPITAL_COMMUNITY): Payer: Self-pay | Admitting: Obstetrics and Gynecology

## 2014-07-15 ENCOUNTER — Other Ambulatory Visit (HOSPITAL_COMMUNITY): Payer: Self-pay

## 2014-07-16 ENCOUNTER — Other Ambulatory Visit (HOSPITAL_COMMUNITY): Payer: Self-pay

## 2014-08-12 ENCOUNTER — Other Ambulatory Visit: Payer: Self-pay | Admitting: Obstetrics and Gynecology

## 2014-08-14 LAB — CYTOLOGY - PAP

## 2014-09-08 ENCOUNTER — Encounter (HOSPITAL_COMMUNITY): Payer: Self-pay | Admitting: Emergency Medicine

## 2014-09-08 ENCOUNTER — Emergency Department (HOSPITAL_COMMUNITY): Payer: 59

## 2014-09-08 ENCOUNTER — Emergency Department (HOSPITAL_COMMUNITY)
Admission: EM | Admit: 2014-09-08 | Discharge: 2014-09-09 | Disposition: A | Discharge status: Home / Self Care | Payer: 59 | Attending: Physician Assistant | Admitting: Physician Assistant

## 2014-09-08 DIAGNOSIS — Z720 Tobacco use: Secondary | ICD-10-CM | POA: Diagnosis not present

## 2014-09-08 DIAGNOSIS — N200 Calculus of kidney: Secondary | ICD-10-CM | POA: Diagnosis not present

## 2014-09-08 DIAGNOSIS — R1031 Right lower quadrant pain: Secondary | ICD-10-CM | POA: Insufficient documentation

## 2014-09-08 DIAGNOSIS — Z3202 Encounter for pregnancy test, result negative: Secondary | ICD-10-CM | POA: Insufficient documentation

## 2014-09-08 DIAGNOSIS — N12 Tubulo-interstitial nephritis, not specified as acute or chronic: Secondary | ICD-10-CM | POA: Diagnosis not present

## 2014-09-08 DIAGNOSIS — Z79899 Other long term (current) drug therapy: Secondary | ICD-10-CM | POA: Diagnosis not present

## 2014-09-08 DIAGNOSIS — Z880 Allergy status to penicillin: Secondary | ICD-10-CM | POA: Insufficient documentation

## 2014-09-08 DIAGNOSIS — N390 Urinary tract infection, site not specified: Secondary | ICD-10-CM | POA: Diagnosis present

## 2014-09-08 LAB — URINALYSIS, ROUTINE W REFLEX MICROSCOPIC
Bilirubin Urine: NEGATIVE
Glucose, UA: NEGATIVE mg/dL
Ketones, ur: NEGATIVE mg/dL
Nitrite: POSITIVE — AB
Protein, ur: NEGATIVE mg/dL
Specific Gravity, Urine: 1.016 (ref 1.005–1.030)
Urobilinogen, UA: 0.2 mg/dL (ref 0.0–1.0)
pH: 6 (ref 5.0–8.0)

## 2014-09-08 LAB — COMPREHENSIVE METABOLIC PANEL
ALT: 9 U/L — ABNORMAL LOW (ref 14–54)
AST: 13 U/L — ABNORMAL LOW (ref 15–41)
Albumin: 3.9 g/dL (ref 3.5–5.0)
Alkaline Phosphatase: 52 U/L (ref 38–126)
Anion gap: 5 (ref 5–15)
BUN: 16 mg/dL (ref 6–20)
CO2: 25 mmol/L (ref 22–32)
Calcium: 8.6 mg/dL — ABNORMAL LOW (ref 8.9–10.3)
Chloride: 104 mmol/L (ref 101–111)
Creatinine, Ser: 1 mg/dL (ref 0.44–1.00)
GFR calc Af Amer: >60 mL/min (ref >60)
GFR calc non Af Amer: >60 mL/min (ref >60)
Glucose, Bld: 111 mg/dL — ABNORMAL HIGH (ref 65–99)
Potassium: 3.7 mmol/L (ref 3.5–5.1)
Sodium: 134 mmol/L — ABNORMAL LOW (ref 135–145)
Total Bilirubin: 0.3 mg/dL (ref 0.3–1.2)
Total Protein: 6.8 g/dL (ref 6.5–8.1)

## 2014-09-08 LAB — CBC WITH DIFFERENTIAL/PLATELET
Basophils Absolute: 0 10*3/uL (ref 0.0–0.1)
Basophils Relative: 0 % (ref 0–1)
Eosinophils Absolute: 0 10*3/uL (ref 0.0–0.7)
Eosinophils Relative: 0 % (ref 0–5)
HCT: 35.7 % — ABNORMAL LOW (ref 36.0–46.0)
Hemoglobin: 12 g/dL (ref 12.0–15.0)
Lymphocytes Relative: 11 % — ABNORMAL LOW (ref 12–46)
Lymphs Abs: 1.2 10*3/uL (ref 0.7–4.0)
MCH: 28.5 pg (ref 26.0–34.0)
MCHC: 33.6 g/dL (ref 30.0–36.0)
MCV: 84.8 fL (ref 78.0–100.0)
Monocytes Absolute: 0.9 10*3/uL (ref 0.1–1.0)
Monocytes Relative: 9 % (ref 3–12)
Neutro Abs: 8.5 10*3/uL — ABNORMAL HIGH (ref 1.7–7.7)
Neutrophils Relative %: 80 % — ABNORMAL HIGH (ref 43–77)
Platelets: 215 10*3/uL (ref 150–400)
RBC: 4.21 MIL/uL (ref 3.87–5.11)
RDW: 15.3 % (ref 11.5–15.5)
WBC: 10.6 10*3/uL — ABNORMAL HIGH (ref 4.0–10.5)

## 2014-09-08 LAB — POC URINE PREG, ED: Preg Test, Ur: NEGATIVE

## 2014-09-08 LAB — URINE MICROSCOPIC-ADD ON

## 2014-09-08 MED ORDER — HYDROMORPHONE HCL 1 MG/ML IJ SOLN
1.0000 mg | Freq: Once | INTRAMUSCULAR | Status: AC
  Administered 2014-09-08: 1 mg via INTRAVENOUS
  Filled 2014-09-08: qty 1

## 2014-09-08 MED ORDER — IOHEXOL 300 MG/ML  SOLN
50.0000 mL | Freq: Once | INTRAMUSCULAR | Status: AC | PRN
Start: 2014-09-08 — End: 2014-09-08
  Administered 2014-09-08: 50 mL via ORAL

## 2014-09-08 MED ORDER — IOHEXOL 300 MG/ML  SOLN
100.0000 mL | Freq: Once | INTRAMUSCULAR | Status: AC | PRN
  Administered 2014-09-08: 100 mL via INTRAVENOUS

## 2014-09-08 MED ORDER — MORPHINE SULFATE 2 MG/ML IJ SOLN
2.0000 mg | Freq: Once | INTRAMUSCULAR | Status: AC
  Administered 2014-09-08: 2 mg via INTRAVENOUS
  Filled 2014-09-08: qty 1

## 2014-09-08 NOTE — ED Provider Notes (Signed)
CSN: [id number]     Arrival date & time 09/08/14  1926 History   First MD Initiated Contact with Patient 09/08/14 2028     Chief Complaint  Patient presents with  . Urinary Tract Infection   HPI   39 year old female presents today with urinary tract infection. Patient notes that 5 days ago she began experiencing increased frequency of urination, burning with urination. Patient reports that she tried over-the-counter cranberry pills, reports symptoms continue to persist. Patient developed a fever on Saturday, with associated right flank pain. Patient went to urgent care this morning where she was found to have a urinary tract infection. Patient was given Cipro, and 5 Vicodin for pain. Patient reports that the Vicodin only minimally improved symptoms, and reports that the right flank pain has began to gravitate towards right lower quadrant. She reports that today she has felt sharp stabbing pain in her right lower quadrant, extremely tender to palpation. Patient reports bowel movements have been normal, last one today. Patient denies nausea /vomiting.    History reviewed. No pertinent past medical history. Past Surgical History  Procedure Laterality Date  . Bowel resection    . Appendectomy    . Cesarean section N/A 09/29/2012    Procedure: Primary CESAREAN SECTION  of baby boy at 1935 APGAR 9/9;  Surgeon: Turner Daniels, MD;  Location: [facility];  Service: Obstetrics;  Laterality: N/A;  . Resection of bowel  2011  . Cesarean section N/A 07/04/2014    Procedure: CESAREAN SECTION;  Surgeon: Harold Hedge, MD;  Location: [facility];  Service: Obstetrics;  Laterality: N/A;  . Tubal ligation     Family History  Problem Relation Age of Onset  . Hypertension Mother   . Hypertension Father   . Asthma Father   . Diabetes Other    History  Substance Use Topics  . Smoking status: Current Every Day Smoker -- 0.10 packs/day  . Smokeless tobacco: Not on file  . Alcohol Use: No   OB History    Gravida Para  Term Preterm AB TAB SAB Ectopic Multiple Living   3 2 2  0 1 1 0 0 0 2     Review of Systems  All other systems reviewed and are negative.   Allergies  Penicillins and Sulfa antibiotics  Home Medications   Prior to Admission medications   Medication Sig Start Date End Date Taking? Authorizing Provider  amphetamine-dextroamphetamine (ADDERALL) 20 MG tablet Take 20 mg by mouth 4 (four) times daily - after meals and at bedtime.    Yes Historical Provider, MD  ciprofloxacin (CIPRO) 500 MG tablet Take 500 mg by mouth 2 (two) times daily. For 14 days 09/08/14 09/22/14 Yes Historical Provider, MD  clonazePAM (KLONOPIN) 1 MG tablet Take 1 mg by mouth 3 (three) times daily as needed for anxiety.  06/20/11  Yes Historical Provider, MD  CRANBERRY PO Take 2 tablets by mouth 3 (three) times daily.   Yes Historical Provider, MD  HYDROcodone-acetaminophen (NORCO/VICODIN) 5-325 MG per tablet Take 2 tablets by mouth every 6 (six) hours as needed. 09/08/14  Yes Historical Provider, MD  ibuprofen (ADVIL,MOTRIN) 200 MG tablet Take 800 mg by mouth every 6 (six) hours as needed for moderate pain.   Yes Historical Provider, MD  naproxen sodium (ANAPROX) 220 MG tablet Take 440 mg by mouth 2 (two) times daily as needed (pain).   Yes Historical Provider, MD  oxyCODONE-acetaminophen (PERCOCET/ROXICET) 5-325 MG per tablet Take 1 tablet by mouth every 4 (four) hours  Cathren LaiOrt75enciaVa Medical Center - [facility] Kickennettllegiance [phone] Dancerlgore n21mRoda ShuttersckyDuanne Gu al<B( s- rd[facility] t[facility]30Ortencia KickCathren LaineJeralyn BennettNovant Health [facility]  Roda Shutters77mCharyl [phone]27Ortencia KickCathren LaineJeralyn BennettJoyce [facility]  Electronically Signed   By: Ellery Plunk M.D.   On: 09/08/2014 23:34     EKG Interpretation None      MDM   Final diagnoses:  Pyelonephritis  Nephrolithiasis    Labs: Point-of-care urine prior, urinalysis, urine culture, CBC, CMP- WBC 10.6, large leukocytes, nitrite positive, hemoglobin urine moderate  Imaging: CT abdomen and pelvis show right pyelonephritis with mild perinephric edema and no abscess no hydronephrosis, left nephrolithiasis Consults:  Therapeutics: Dilaudid, morphine  Discharge Meds:   Assessment/Plan: Patient presents with urinary tract infectious and likely pyelonephritis. She had right lower quadrant pain, was treated here in the emergency room with pain medication with good symptomatic improvement. CT scan showed no acute findings for right lower quadrant pain or tenderness. Patient was found to have a kidney stone on the left, this is likely incidental and not related to present condition. Patient was informed of this kidney stone and  given strict instructions to follow-up with kidney specialist for further evaluation and management. Patient will be given oral pain medication, and strict return precautions. Patient is nontoxic-appearing, tolerating by mouth, no vomiting, good candidate for outpatient treatment. Patient has only been taking medication for one day, encouraged to continue using Cipro. Patient verbalizes understanding and agreement for today's plan, and assured her follow-up evaluation.         Eyvonne Mechanic, PA-C 09/09/14 0142  Courteney Randall An, MD 09/09/14 [phone]

## 2014-09-08 NOTE — ED Notes (Signed)
Pt states she has had signs of a UTI since Thursday  Pt was seen by her PCP this morning and was diagnosed with UTI and was given cipro and vicodin  Pt states the vicodin has not touched her pain and she was only given 5 tabs and that will definitely not be enough  Pt states she had labs done at the dr office  Pt has copy of them with her  Pt states the pain has gotten worse today and when she was she was walking to the bathroom she grabbed her right flank area and found a knot  Pt is concerned and wants that checked

## 2014-09-08 NOTE — ED Notes (Signed)
Patient transported to CT 

## 2014-09-09 MED ORDER — OXYCODONE-ACETAMINOPHEN 5-325 MG PO TABS: 1.0000 | ORAL_TABLET | ORAL | Status: DC | PRN

## 2014-09-09 MED ORDER — OXYCODONE-ACETAMINOPHEN 5-325 MG PO TABS
1.0000 | ORAL_TABLET | Freq: Once | ORAL | Status: AC
  Administered 2014-09-09: 1 via ORAL
  Filled 2014-09-09: qty 1

## 2014-09-09 NOTE — Discharge Instructions (Signed)
Kidney Stones Kidney stones (urolithiasis) are solid masses that form inside your kidneys. The intense pain is caused by the stone moving through the kidney, ureter, bladder, and urethra (urinary tract). When the stone moves, the ureter starts to spasm around the stone. The stone is usually passed in your pee (urine).  HOME CARE  Drink enough fluids to keep your pee clear or pale yellow. This helps to get the stone out.  Strain all pee through the provided strainer. Do not pee without peeing through the strainer, not even once. If you pee the stone out, catch it in the strainer. The stone may be as small as a grain of salt. Take this to your doctor. This will help your doctor figure out what you can do to try to prevent more kidney stones.  Only take medicine as told by your doctor.  Follow up with your doctor as told.  Get follow-up X-rays as told by your doctor. GET HELP IF: You have pain that gets worse even if you have been taking pain medicine. GET HELP RIGHT AWAY IF:   Your pain does not get better with medicine.  You have a fever or shaking chills.  Your pain increases and gets worse over 18 hours.  You have new belly (abdominal) pain.  You feel faint or pass out.  You are unable to pee. MAKE SURE YOU:   Understand these instructions.  Will watch your condition.  Will get help right away if you are not doing well or get worse. Document Released: 07/20/2007 Document Revised: 10/03/2012 Document Reviewed: 07/04/2012 [facility] Patient Information 2015 [place], [place]. This information is not intended to replace advice given to you by your health care provider. Make sure you discuss any questions you have with your health care provider.  Please continue using antibiotics, use pain medication as needed for pain. Please do not drink drive or operate machinery while taking. Please monitor for new or worsening signs or symptoms, return immediately if any present. Please contact  kidney specialist as soon as possible for further evaluation and management of your kidney stone.

## 2014-09-09 NOTE — ED Provider Notes (Signed)
12:13 AM Medical screening examination/treatment/procedure(s) were conducted as a shared visit with non-physician practitioner(s) and myself.  I personally evaluated the patient during the encounter.  I saw patient with APP and agree with the assessment.   Patient is presenting with CVA tenderness and UTI. Patient has been seen at multiple providers for this pain. Including [facility], mini clinic, urgent care. Patient is here with her father who did an abdominal exam and felt like there was a lump in her right lower quadrant. I am unable to palpate this lump. CT shows right pyelonephritis. No fever no white count. Incidental  2 mm stone on the left side. Given that this is an infection with the presence of a stone: Careful precautions were given to patient and patient's father. Patient will call urology in the morning. Patient will return with any fever nausea vomiting or other symptoms.  Patient and father expressed wish to return home and understanding of return precautions.  Shae Hinnenkamp Randall An, MD 09/09/14 1610

## 2014-09-10 LAB — URINE CULTURE: Culture: NO GROWTH

## 2015-10-02 LAB — HM PAP SMEAR

## 2016-08-08 ENCOUNTER — Emergency Department (HOSPITAL_COMMUNITY): Payer: 59

## 2016-08-08 ENCOUNTER — Encounter (HOSPITAL_COMMUNITY): Payer: Self-pay | Admitting: Emergency Medicine

## 2016-08-08 ENCOUNTER — Emergency Department (HOSPITAL_COMMUNITY)
Admission: EM | Admit: 2016-08-08 | Discharge: 2016-08-10 | Disposition: A | Discharge status: Home / Self Care | Payer: 59 | Attending: Emergency Medicine | Admitting: Emergency Medicine

## 2016-08-08 DIAGNOSIS — T40604A Poisoning by unspecified narcotics, undetermined, initial encounter: Secondary | ICD-10-CM | POA: Diagnosis not present

## 2016-08-08 DIAGNOSIS — Z79899 Other long term (current) drug therapy: Secondary | ICD-10-CM | POA: Diagnosis not present

## 2016-08-08 DIAGNOSIS — R1110 Vomiting, unspecified: Secondary | ICD-10-CM | POA: Diagnosis present

## 2016-08-08 DIAGNOSIS — Y69 Unspecified misadventure during surgical and medical care: Secondary | ICD-10-CM | POA: Insufficient documentation

## 2016-08-08 DIAGNOSIS — F17210 Nicotine dependence, cigarettes, uncomplicated: Secondary | ICD-10-CM | POA: Diagnosis not present

## 2016-08-08 DIAGNOSIS — T887XXA Unspecified adverse effect of drug or medicament, initial encounter: Secondary | ICD-10-CM | POA: Insufficient documentation

## 2016-08-08 DIAGNOSIS — F4329 Adjustment disorder with other symptoms: Secondary | ICD-10-CM | POA: Diagnosis present

## 2016-08-08 DIAGNOSIS — R4182 Altered mental status, unspecified: Secondary | ICD-10-CM | POA: Diagnosis not present

## 2016-08-08 LAB — CBC WITH DIFFERENTIAL/PLATELET
BASOS ABS: 0 10*3/uL (ref 0.0–0.1)
Basophils Relative: 0 %
Eosinophils Absolute: 0.2 10*3/uL (ref 0.0–0.7)
Eosinophils Relative: 2 %
HCT: 36.5 % (ref 36.0–46.0)
Hemoglobin: 12.4 g/dL (ref 12.0–15.0)
LYMPHS ABS: 1.5 10*3/uL (ref 0.7–4.0)
LYMPHS PCT: 16 %
MCH: 29.7 pg (ref 26.0–34.0)
MCHC: 34 g/dL (ref 30.0–36.0)
MCV: 87.5 fL (ref 78.0–100.0)
Monocytes Absolute: 0.3 10*3/uL (ref 0.1–1.0)
Monocytes Relative: 4 %
Neutro Abs: 7.1 10*3/uL (ref 1.7–7.7)
Neutrophils Relative %: 78 %
Platelets: 273 10*3/uL (ref 150–400)
RBC: 4.17 MIL/uL (ref 3.87–5.11)
RDW: 12.8 % (ref 11.5–15.5)
WBC: 9.1 10*3/uL (ref 4.0–10.5)

## 2016-08-08 LAB — COMPREHENSIVE METABOLIC PANEL
ALT: 19 U/L (ref 14–54)
AST: 19 U/L (ref 15–41)
Albumin: 3.9 g/dL (ref 3.5–5.0)
Alkaline Phosphatase: 76 U/L (ref 38–126)
Anion gap: 7 (ref 5–15)
BILIRUBIN TOTAL: 0.1 mg/dL — AB (ref 0.3–1.2)
BUN: 14 mg/dL (ref 6–20)
CO2: 25 mmol/L (ref 22–32)
CREATININE: 0.77 mg/dL (ref 0.44–1.00)
Calcium: 9 mg/dL (ref 8.9–10.3)
Chloride: 106 mmol/L (ref 101–111)
GFR calc Af Amer: >60 mL/min (ref >60)
Glucose, Bld: 125 mg/dL — ABNORMAL HIGH (ref 65–99)
Potassium: 4 mmol/L (ref 3.5–5.1)
Sodium: 138 mmol/L (ref 135–145)
TOTAL PROTEIN: 6.7 g/dL (ref 6.5–8.1)

## 2016-08-08 LAB — URINALYSIS, ROUTINE W REFLEX MICROSCOPIC
Bacteria, UA: NONE SEEN
Bilirubin Urine: NEGATIVE
GLUCOSE, UA: NEGATIVE mg/dL
Hgb urine dipstick: NEGATIVE
Ketones, ur: NEGATIVE mg/dL
Nitrite: NEGATIVE
PH: 7 (ref 5.0–8.0)
PROTEIN: NEGATIVE mg/dL
Specific Gravity, Urine: 1.011 (ref 1.005–1.030)

## 2016-08-08 LAB — CBG MONITORING, ED: GLUCOSE-CAPILLARY: 129 mg/dL — AB (ref 65–99)

## 2016-08-08 LAB — HCG, SERUM, QUALITATIVE: Preg, Serum: NEGATIVE

## 2016-08-08 LAB — RAPID URINE DRUG SCREEN, HOSP PERFORMED
Amphetamines: NOT DETECTED
BARBITURATES: NOT DETECTED
Benzodiazepines: NOT DETECTED
COCAINE: NOT DETECTED
Opiates: POSITIVE — AB
Tetrahydrocannabinol: NOT DETECTED

## 2016-08-08 LAB — ACETAMINOPHEN LEVEL: Acetaminophen (Tylenol), Serum: <10 ug/mL — ABNORMAL LOW (ref 10–30)

## 2016-08-08 LAB — SALICYLATE LEVEL: Salicylate Lvl: <7 mg/dL (ref 2.8–30.0)

## 2016-08-08 LAB — ETHANOL: Alcohol, Ethyl (B): <5 mg/dL (ref <5)

## 2016-08-08 MED ORDER — NALOXONE HCL 2 MG/2ML IJ SOSY
1.0000 mg | PREFILLED_SYRINGE | INTRAMUSCULAR | Status: DC | PRN
  Administered 2016-08-08: 1 mg via INTRAVENOUS
  Filled 2016-08-08 (×2): qty 2

## 2016-08-08 MED ORDER — ONDANSETRON HCL 4 MG/2ML IJ SOLN
INTRAMUSCULAR | Status: AC
  Filled 2016-08-08: qty 2

## 2016-08-08 MED ORDER — ONDANSETRON HCL 4 MG/2ML IJ SOLN
4.0000 mg | Freq: Once | INTRAMUSCULAR | Status: AC
  Administered 2016-08-08: 4 mg via INTRAVENOUS

## 2016-08-08 NOTE — ED Triage Notes (Addendum)
Pt from home with c/o emesis and ear infection. Pt was diagnosed with ear infection yesterday and started on antibiotics. Pt has hx of drug abuse. Pt's father gave her 1 percocet for ear pain and told her to take half earlier today and take half in 6 hours. The father then took pt's kids to dinner and when he came back, pt was somnolent. Pt responds to pain.

## 2016-08-08 NOTE — ED Provider Notes (Signed)
[facility]-EMERGENCY DEPT Provider Note   CSN: [id number] Arrival date & time: 08/08/16  1920     History   Chief Complaint Chief Complaint  Patient presents with  . Emesis  . Otitis Media    HPI [name] is a 41 y.o. female. "Lethargic"  HPI:  Patient presents accompanied by her father from home. She complained of pain. She was seen at an urgent care yesterday and placed on Omnicef. Dad states she complained of increasing ear pain over the course of the day yesterday and today. At 4:30 this afternoon he took HER-2-year-old and 4-year-old and he and his wife went to dinner. He came home at 6:30 and states that she was on the couch not verbally responsive to him. He shook her and she "made a strange sound".  Patient's father states that he gave her a hydrocodone yesterday. Triage note states that she was given a Percocet today. He denies this.  He states that she has a history of drug use in the hydrocodone was previously her code of choice. He is not aware of any recent use. Denies alcohol use. Her father spoke with his wife at home who denied that the patient was still taking Klonopin.  History reviewed. No pertinent past medical history.  Patient Active Problem List   Diagnosis Date Noted  . Oligohydramnios 07/04/2014    Past Surgical History:  Procedure Laterality Date  . APPENDECTOMY    . BOWEL RESECTION    . CESAREAN SECTION N/A 09/29/2012   Procedure: Primary CESAREAN SECTION  of baby boy at 1935 APGAR 9/9;  Surgeon: David C Lowe, MD;  Location: [facility];  Service: Obstetrics;  Laterality: N/A;  . CESAREAN SECTION N/A 07/04/2014   Procedure: CESAREAN SECTION;  Surgeon:  Tomblin, MD;  Location: [facility];  Service: Obstetrics;  Laterality: N/A;  . resection of bowel  2011  . TUBAL LIGATION      OB History    Gravida Para Term Preterm AB Living   3 2 2 0 1 2   SAB TAB Ectopic Multiple Live Births   0 1 0 0 2       Home Medications    Prior to Admission  medications   Medication Sig Start Date End Date Taking? Authorizing Provider  amphetamine-dextroamphetamine (ADDERALL) 20 MG tablet Take 20 mg by mouth 4 (four) times daily - after meals and at bedtime.    Yes [provider]  cefdinir (OMNICEF) 300 MG capsule Take 300 mg by mouth 2 (two) times daily.   Yes [provider]  clonazePAM (KLONOPIN) 1 MG tablet Take 1 mg by mouth 3 (three) times daily as needed for anxiety.  06/20/11  Yes [provider]  HYDROcodone-acetaminophen (NORCO) 7.5-325 MG tablet Take 1 tablet by mouth every 6 (six) hours as needed for moderate pain.   Yes [provider]  oxyCODONE-acetaminophen (PERCOCET/ROXICET) 5-325 MG per tablet Take 1 tablet by mouth every 4 (four) hours as needed for severe pain. Patient not taking: Reported on 08/08/2016 09/09/14   Hedges, Jeffrey, PA-C    Family History Family History  Problem Relation Age of Onset  . Hypertension Mother   . Hypertension Father   . Asthma Father   . Diabetes Other     Social History Social History  Substance Use Topics  . Smoking status: Current Every Day Smoker    Packs/day: 0.10  . Smokeless tobacco: Not on file  . Alcohol use No     Allergies     Penicillins and Sulfa antibiotics   Review of Systems Review of Systems  Unable to perform ROS: Mental status change     Physical Exam Updated Vital Signs BP (!) 124/91 (BP Location: Left Arm)   Pulse 69   Temp (!) 96.8 F (36 C) (Rectal)   Resp 20   SpO2 99%   Physical Exam  Constitutional: No distress.  Listless. Head tilted to the right. We'll hold her head midline when repositioned. Not drooling. Has a gag.  HENT:  Head: Normocephalic.  Eyes: Conjunctivae are normal. Pupils are equal, round, and reactive to light. No scleral icterus.  2 Millimeters. Symmetric.  Neck: Normal range of motion. Neck supple. No thyromegaly present.  Cardiovascular: Normal rate and regular rhythm.  Exam reveals no gallop  and no friction rub.   No murmur heard. Pulmonary/Chest: Effort normal and breath sounds normal. No respiratory distress. She has no wheezes. She has no rales.  Abdominal: Soft. Bowel sounds are normal. She exhibits no distension. There is no tenderness. There is no rebound.  Musculoskeletal: Normal range of motion.  Neurological: GCS eye subscore is 3. GCS verbal subscore is 3. GCS motor subscore is 5.  Skin: Skin is warm and dry. No rash noted.     ED Treatments / Results  Labs (all labs ordered are listed, but only abnormal results are displayed) Labs Reviewed  COMPREHENSIVE METABOLIC PANEL - Abnormal; Notable for the following:       Result Value   Glucose, Bld 125 (*)    Total Bilirubin 0.1 (*)    All other components within normal limits  URINALYSIS, ROUTINE W REFLEX MICROSCOPIC - Abnormal; Notable for the following:    Leukocytes, UA SMALL (*)    Squamous Epithelial / LPF 0-5 (*)    All other components within normal limits  RAPID URINE DRUG SCREEN, HOSP PERFORMED - Abnormal; Notable for the following:    Opiates POSITIVE (*)    All other components within normal limits  ACETAMINOPHEN LEVEL - Abnormal; Notable for the following:    Acetaminophen (Tylenol), Serum <10 (*)    All other components within normal limits  CBG MONITORING, ED - Abnormal; Notable for the following:    Glucose-Capillary 129 (*)    All other components within normal limits  CBC WITH DIFFERENTIAL/PLATELET  HCG, SERUM, QUALITATIVE  ETHANOL  SALICYLATE LEVEL    EKG  EKG Interpretation None       Radiology Ct Head Wo Contrast  Result Date: 08/08/2016 CLINICAL DATA:  Emesis and ear infection, headache with altered mental status EXAM: CT HEAD WITHOUT CONTRAST TECHNIQUE: Contiguous axial images were obtained from the base of the skull through the vertex without intravenous contrast. COMPARISON:  12/16/2010 FINDINGS: Brain: No acute territorial infarction, hemorrhage, or intracranial mass is seen.  The ventricles are nonenlarged. Prominent pineal gland calcification. Vascular: No hyperdense vessels.  No unexpected calcifications. Skull: No fracture.  Mastoid air cells are clear. Sinuses/Orbits: Fluid level in the left maxillary sinus. Moderate opacification of the ethmoid sinuses. No acute orbital abnormality. Other: None IMPRESSION: 1. No CT evidence for acute intracranial abnormality. 2. Sinusitis Electronically Signed   By: Donavan Foil M.D.   On: 08/08/2016 20:55    Procedures Procedures (including critical care time)  Medications Ordered in ED Medications  naloxone [employer]) injection 1 mg (1 mg Intravenous Given 08/08/16 2028)  ondansetron (ZOFRAN) 4 MG/2ML injection (not administered)  ondansetron (ZOFRAN) injection 4 mg (4 mg Intravenous Given 08/08/16 2030)     Initial  Impression / Assessment and Plan / ED Course  I have reviewed the triage vital signs and the nursing notes.  Pertinent labs & imaging results that were available during my care of the patient were reviewed by me and considered in my medical decision making (see chart for details).    Given Narcan 1mg iv but IV noted to be infiltrated. Repeated .4 iv and patient awake. Remainder of studies non-revealing. Pt remains awake, but slurring speech. Did not require additional narcan rescue. Will bos and repeat evaluations until awake.  Father questions as to whether this "could have been a suicide attempt". Pt too somnolent for thorough interview. Will await metabolism to clarity, and red-evaluatin, ACT eva.    Final Clinical Impressions(s) / ED Diagnoses   Final diagnoses:  Opiate overdose, undetermined intent, initial encounter    New Prescriptions New Prescriptions   No medications on file     , , MD 08/08/16 2320  

## 2016-08-08 NOTE — Progress Notes (Signed)
Patient non-responsive unable to do TTS at this time Charlye Spare K. Sherlon HandingHarris, LCAS-A, LPC-A, [facility]  Counselor 08/08/2016 11:45 PM

## 2016-08-09 MED ORDER — LORAZEPAM 1 MG PO TABS
1.0000 mg | ORAL_TABLET | ORAL | Status: AC | PRN
  Administered 2016-08-09: 1 mg via ORAL
  Filled 2016-08-09: qty 1

## 2016-08-09 MED ORDER — ZIPRASIDONE MESYLATE 20 MG IM SOLR: 20.0000 mg | INTRAMUSCULAR | Status: DC | PRN

## 2016-08-09 MED ORDER — RISPERIDONE 1 MG PO TBDP
2.0000 mg | ORAL_TABLET | Freq: Three times a day (TID) | ORAL | Status: DC | PRN
  Administered 2016-08-09: 2 mg via ORAL
  Filled 2016-08-09: qty 2

## 2016-08-09 NOTE — ED Notes (Signed)
Patient says she took 3 vicodin last night.

## 2016-08-09 NOTE — ED Notes (Signed)
Bed: [facility]29 Expected date:  Expected time:  Means of arrival:  Comments: Triage 3 

## 2016-08-09 NOTE — ED Notes (Signed)
Jun 26 06:16 06:16:42 CT HEAD WO CONTRAST Completed CT HEAD WO CONTRAST DIAZ GUIJOZA, Haedyn Breau   Didn't do and tried to deleted this column.

## 2016-08-09 NOTE — ED Notes (Signed)
Patient phoned her father and told him that she was going to be admitted for 5 days. To my knowledge, this is not an accurate statement and decisions regarding admission have not yet been made. Patient is also not up for discharge at this time. Patient obviously paranoid and agitated. TTS consult pending.

## 2016-08-09 NOTE — ED Notes (Signed)
Bed: [facility]43 Expected date:  Expected time:  Means of arrival:  Comments: Room 29 

## 2016-08-09 NOTE — ED Notes (Signed)
Patient reported taking antibiotics at home for hear infection and would like to continue the treatment here. States she started on Sunday and the prescription is for 10 days.  Called provider twice. Provider responded he is in a patient's room. To call back.  Awaits call at this time.

## 2016-08-09 NOTE — ED Notes (Signed)
Introduced self to patient. Pt oriented to unit expectations.  Assessed pt for:  A) Anxiety &/or agitation: On admission to the [facility] pt is irritable and said that she feels strange. She said that she was not trying to kill herself, "not at all" when she took pain medication. Denies SI/HI/AVH.  S) Safety: Safety maintained with q-15-minute checks and hourly rounds by staff.  A) ADLs: Pt able to perform ADLs independently.  P) Pick-Up (room cleanliness): Pt's room clean and free of clutter.

## 2016-08-09 NOTE — ED Notes (Signed)
Patient's father's cell phone number added to chart

## 2016-08-09 NOTE — ED Notes (Addendum)
Patient's father at the bedside. Patient's father spoke to Director of ED for concerns that he was being wanded and having belongings locked up. Patient alert but does not remember events from last night.

## 2016-08-09 NOTE — ED Notes (Signed)
TTS will come see patient soon for eval.

## 2016-08-09 NOTE — ED Notes (Signed)
Father left. Concerned that patient hasn't received omincef that she was prescribed for ear infection. Will communicate that to EDP. Father also questioning what recommendations patient will receive from psych. None received at this time. Father given his personal belongings.

## 2016-08-09 NOTE — BH Assessment (Addendum)
Assessment Note  [name] is an 41 y.o. female with history of opiate abuse and ADHD. Patient BIB to [facility] Emergency Department by her father. She sts, "I was brought here because of something that has to do with Vicodin and a bad ear infection. Sts that she went to urgent care yesterday and was diagnosed with a ear infection. She reports taking 3 percocet for ear pain. Patient was later found unresponsive. She does have a history of opiate abuse. She received substance abuse treatment in a [place] rehabiliation facility 16yrs ago. Patient denies that she has since abused opiates but does take them only a daily basis as prescribed. She denies further illicit drug use. No alcohol use. She denies SI. No history of suicide attempts or gestures. No self mutilating behaviors. She has a family history of Bipolar Disorder (aunt-maternal). She reports symptoms of depression including loss of interest in usual pleasures and fatigue. She identifies her job as being very stressful. No HI. No AVH's.   Patient does not appear to be responding to internal stimuli. She is current dressed in scrubs. She is oriented to time, person, place, and situation. Patient is however very anxious and appears paranoid. She is agitated. She laughs inappropriately. Patient is easily distracted. Speech is rapid and pressured. No history of abuse. Sts that her dad is her support system.   Diagnosis: Depressive Disorder and Substance Induced Mood Disorder  Past Medical History: History reviewed. No pertinent past medical history.  Past Surgical History:  Procedure Laterality Date  . APPENDECTOMY    . BOWEL RESECTION    . CESAREAN SECTION N/A 09/29/2012   Procedure: Primary CESAREAN SECTION  of baby boy at 1935 APGAR 9/9;  Surgeon: Turner Daniels, MD;  Location: [facility];  Service: Obstetrics;  Laterality: N/A;  . CESAREAN SECTION N/A 07/04/2014   Procedure: CESAREAN SECTION;  Surgeon: Harold Hedge, MD;  Location: [facility];   Service: Obstetrics;  Laterality: N/A;  . resection of bowel  2011  . TUBAL LIGATION      Family History:  Family History  Problem Relation Age of Onset  . Hypertension Mother   . Hypertension Father   . Asthma Father   . Diabetes Other     Social History:  reports that she has been smoking.  She has been smoking about 0.10 packs per day. She does not have any smokeless tobacco history on file. She reports that she does not drink alcohol or use drugs.  Additional Social History:  Alcohol / Drug Use Pain Medications: SEE MAR Prescriptions: SEE MAR Over the Counter: SEE MAR History of alcohol / drug use?: Yes Substance #1 Name of Substance 1: Opiates "Vicodin" and Percocet" 1 - Age of First Use: "I can't remember" 1 - Amount (size/oz): up to 3 pills  1 - Frequency: ongoing  1 - Duration: daily  1 - Last Use / Amount: 08/08/2016; "3 Percocets"  CIWA: CIWA-Ar BP: 129/88 Pulse Rate: 70 COWS: Clinical Opiate Withdrawal Scale (COWS) Resting Pulse Rate: Pulse Rate 80 or below Sweating: No report of chills or flushing Restlessness: Frequent shifting or extraneous movements of legs/arms Pupil Size: Pupils pinned or normal size for room light Bone or Joint Aches: Not present Runny Nose or Tearing: Not present GI Upset: Vomiting or diarrhea Tremor: No tremor Yawning: No yawning Anxiety or Irritability: Patient obviously irritable/anxious Gooseflesh Skin: Skin is smooth COWS Total Score: 8  Allergies:  Allergies  Allergen Reactions  . Penicillins Anaphylaxis  . Sulfa  Antibiotics Anaphylaxis    Home Medications:  (Not in a hospital admission)  OB/GYN Status:  No LMP recorded.  General Assessment Data Location of Assessment: [facility] ED TTS Assessment: In system Is this a Tele or Face-to-Face Assessment?: Face-to-Face Is this an Initial Assessment or a Re-assessment for this encounter?: Initial Assessment Marital status: Single Maiden name:  (n/a) Is patient pregnant?:  No Pregnancy Status: No Living Arrangements: Other (Comment) Admission Status: Voluntary Is patient capable of signing voluntary admission?: No Referral Source: Self/Family/Friend     Crisis Care Plan Living Arrangements: Other (Comment) Legal Guardian: Other: (no legal guardian ) Name of Psychiatrist:  (Dr. Archer Asaupindaur Kaur ) Name of Therapist:  (n/a)  Education Status Is patient currently in school?: No Current Grade:  (n/a) Highest grade of school patient has completed:  Clinical research associate(Bachelors Degree ) Name of school:  (n/a) Contact person:  (n/a)  Risk to self with the past 6 months Suicidal Ideation: No Has patient been a risk to self within the past 6 months prior to admission? : No Suicidal Intent: No Has patient had any suicidal intent within the past 6 months prior to admission? : No Is patient at risk for suicide?: No Suicidal Plan?: No Has patient had any suicidal plan within the past 6 months prior to admission? : No Access to Means: No What has been your use of drugs/alcohol within the last 12 months?:  (n/a) Previous Attempts/Gestures: No How many times?:  (0) Other Self Harm Risks:  (no self harm risk ) Triggers for Past Attempts: Other (Comment) (no triggers for past attempts ) Intentional Self Injurious Behavior: None (denies ) Family Suicide History: Yes (maternal aunt- Bipolar Disorder/ECT treatment ) Recent stressful life event(s): Other (Comment) ("My job is very stressful") Persecutory voices/beliefs?: No Depression: Yes Depression Symptoms: Fatigue, Loss of interest in usual pleasures Substance abuse history and/or treatment for substance abuse?: No Suicide prevention information given to non-admitted patients: Not applicable  Risk to Others within the past 6 months Homicidal Ideation: No Does patient have any lifetime risk of violence toward others beyond the six months prior to admission? : No Thoughts of Harm to Others: No Current Homicidal Intent:  No Current Homicidal Plan: No Access to Homicidal Means: No Identified Victim:  (n/a) History of harm to others?: No Assessment of Violence: None Noted Violent Behavior Description:  (currently cooperative ) Does patient have access to weapons?: No Criminal Charges Pending?: No Does patient have a court date: No Is patient on probation?: No  Psychosis Hallucinations: None noted Delusions: None noted  Mental Status Report Appearance/Hygiene: Disheveled Eye Contact: Fair Motor Activity: Restlessness Speech: Logical/coherent Level of Consciousness: Alert Mood: Anxious, Preoccupied Affect: Anxious, Irritable, Preoccupied Anxiety Level: Severe Thought Processes: Circumstantial, Relevant Judgement: Impaired Orientation: Person, Time, Situation, Place Obsessive Compulsive Thoughts/Behaviors: None  Cognitive Functioning Concentration: Decreased Memory: Recent Intact, Remote Intact IQ: Average Insight: Poor Impulse Control: Fair Appetite: Good Weight Loss:  (none reported ) Weight Gain:  (none reported) Sleep: No Change Total Hours of Sleep:  (10 hrs of sleep) Vegetative Symptoms: None  ADLScreening [facility]) Patient's cognitive ability adequate to safely complete daily activities?: Yes Patient able to express need for assistance with ADLs?: Yes Independently performs ADLs?: Yes (appropriate for developmental age)  Prior Inpatient Therapy Prior Inpatient Therapy: Yes Prior Therapy Dates:  (10 yrs ago) Prior Therapy Facilty/Provider(s[place]  [place] Reason for Treatment:  (substance abuse (opiates))  Prior Outpatient Therapy Prior Outpatient Therapy: Yes Prior Therapy Dates:  (  current) Prior Therapy Facilty/Provider(s):  (Dr. Archer Asa ) Reason for Treatment:  (medication management ) Does patient have an ACCT team?: No Does patient have Intensive In-House Services?  : No Does patient have [facility] services? : No Does patient have  P4CC services?: No  ADL Screening (condition at time of admission) Patient's cognitive ability adequate to safely complete daily activities?: Yes Is the patient deaf or have difficulty hearing?: No Does the patient have difficulty seeing, even when wearing glasses/contacts?: No Does the patient have difficulty concentrating, remembering, or making decisions?: No Patient able to express need for assistance with ADLs?: Yes Does the patient have difficulty dressing or bathing?: No Independently performs ADLs?: Yes (appropriate for developmental age) Does the patient have difficulty walking or climbing stairs?: No Weakness of Legs: None Weakness of Arms/Hands: None  Home Assistive Devices/Equipment Home Assistive Devices/Equipment: None    Abuse/Neglect Assessment (Assessment to be complete while patient is alone) Physical Abuse: Denies Verbal Abuse: Denies Sexual Abuse: Denies Exploitation of patient/patient's resources: Denies Self-Neglect: Denies Values / Beliefs Cultural Requests During Hospitalization: None Spiritual Requests During Hospitalization: None   Advance Directives (For Healthcare) Does Patient Have a Medical Advance Directive?: No Nutrition Screen- [facility] Patient's home diet: Regular  Additional Information 1:1 In Past 12 Months?: No CIRT Risk: No Elopement Risk: No Does patient have medical clearance?: Yes     Disposition: Per Dr. Jannifer Franklin and Carleene Overlie, NP patient to remain in the ED overnight. Pending am psych evaluation.   Disposition Initial Assessment Completed for this Encounter: Yes  On Site Evaluation by:   Reviewed with Physician:    Melynda Ripple 08/09/2016 9:41 AM

## 2016-08-10 DIAGNOSIS — F4329 Adjustment disorder with other symptoms: Secondary | ICD-10-CM | POA: Diagnosis present

## 2016-08-10 DIAGNOSIS — F17210 Nicotine dependence, cigarettes, uncomplicated: Secondary | ICD-10-CM

## 2016-08-10 DIAGNOSIS — T40601A Poisoning by unspecified narcotics, accidental (unintentional), initial encounter: Secondary | ICD-10-CM | POA: Insufficient documentation

## 2016-08-10 DIAGNOSIS — T40604A Poisoning by unspecified narcotics, undetermined, initial encounter: Secondary | ICD-10-CM

## 2016-08-10 MED ORDER — CEFDINIR 300 MG PO CAPS: 300.0000 mg | ORAL_CAPSULE | Freq: Two times a day (BID) | ORAL | 0 refills | Status: AC

## 2016-08-10 MED ORDER — CEFDINIR 125 MG/5ML PO SUSR
300.0000 mg | Freq: Two times a day (BID) | ORAL | Status: DC
  Administered 2016-08-10: 300 mg via ORAL
  Filled 2016-08-10: qty 15

## 2016-08-10 NOTE — Consult Note (Signed)
[place] Psychiatry Consult   Reason for Consult:   Referring Physician:  EDP Patient Identification: [name] MRN:  [id number] Principal Diagnosis: Adjustment disorder with disturbance of emotion Diagnosis:   Patient Active Problem List   Diagnosis Date Noted  . Adjustment disorder with disturbance of emotion [F43.29] 08/10/2016  . Opiate overdose [T40.601A]   . Oligohydramnios [O41.00X0] 07/04/2014    Total Time spent with patient: 30 minutes  Subjective:   Alicia Orea is a 41 y.o. female patient admitted with unintentional drug overdose.  HPI:  [name] is a 41 year old female who presented to the [facility] after ingesting a total of 6 Vicodin that she got from a friend because she was having ear pain. Pt has a history of drug use. Pt was forund at home by her father somnolent and he brought her to the ED. Narcan was administered.  Pt denies suicidal/homicidal ideation, denies auditory/visual hallucinations and does not appear to be responding to internal stimuli. Pt stated she was only trying to relieve her pain. Pt sees Dr Toy Care for psychiatry and medication management. Pt stated she was on a lapse of her ADHD medication because she missed an appointment but she picked her prescription up Monday and has not had it filled yet. Pt stated she is under a lot of stress at work and "just was trying to get her mind to settle down." Pt is aware she used poor judgement in making this choice. Pt is stable and psychiatrically cleared for discharge.   Past Psychiatric History: ADHD, Substance Abuse  Risk to Self: None Risk to Others: None Prior Inpatient Therapy: Prior Inpatient Therapy: Yes Prior Therapy Dates:  (10 yrs ago) Prior Therapy Facilty/Provider(s):  (facility in [place] ) Reason for Treatment:  (substance abuse (opiates)) Prior Outpatient Therapy: Prior Outpatient Therapy: Yes Prior Therapy Dates:  (current) Prior Therapy Facilty/Provider(s):  (Dr.  Kerin Salen ) Reason for Treatment:  (medication management ) Does patient have an ACCT team?: No Does patient have Intensive In-House Services?  : No Does patient have [facility] services? : No Does patient have P4CC services?: No  Past Medical History: History reviewed. No pertinent past medical history.  Past Surgical History:  Procedure Laterality Date  . APPENDECTOMY    . BOWEL RESECTION    . CESAREAN SECTION N/A 09/29/2012   Procedure: Primary CESAREAN SECTION  of baby boy at [place] 9/9;  Surgeon: Luz Lex, MD;  Location: [facility];  Service: Obstetrics;  Laterality: N/A;  . CESAREAN SECTION N/A 07/04/2014   Procedure: CESAREAN SECTION;  Surgeon: Everlene Farrier, MD;  Location: [facility];  Service: Obstetrics;  Laterality: N/A;  . resection of bowel  2011  . TUBAL LIGATION     Family History:  Family History  Problem Relation Age of Onset  . Hypertension Mother   . Hypertension Father   . Asthma Father   . Diabetes Other    Family Psychiatric  History: Unknown Social History:  History  Alcohol Use No     History  Drug Use No    Social History   Social History  . Marital status: Single    Spouse name: N/A  . Number of children: N/A  . Years of education: N/A   Social History Main Topics  . Smoking status: Current Every Day Smoker    Packs/day: 0.10  . Smokeless tobacco: None  . Alcohol use No  . Drug use: No  . Sexual activity: Yes   Other Topics Concern  .  None   Social History Narrative  . None   Additional Social History:    Allergies:   Allergies  Allergen Reactions  . Penicillins Anaphylaxis    Tolerated Cefotetan x 1; Cefdinir x multiple doses  . Sulfa Antibiotics Anaphylaxis    Labs:  Results for orders placed or performed during the hospital encounter of 08/08/16 (from the past 48 hour(s))  CBG monitoring, ED     Status: Abnormal   Collection Time: 08/08/16  8:08 PM  Result Value Ref Range   Glucose-Capillary 129 (H) 65 - 99 mg/dL   Urinalysis, Routine w reflex microscopic     Status: Abnormal   Collection Time: 08/08/16  8:10 PM  Result Value Ref Range   Color, Urine YELLOW YELLOW   APPearance CLEAR CLEAR   Specific Gravity, Urine 1.011 1.005 - 1.030   pH 7.0 5.0 - 8.0   Glucose, UA NEGATIVE NEGATIVE mg/dL   Hgb urine dipstick NEGATIVE NEGATIVE   Bilirubin Urine NEGATIVE NEGATIVE   Ketones, ur NEGATIVE NEGATIVE mg/dL   Protein, ur NEGATIVE NEGATIVE mg/dL   Nitrite NEGATIVE NEGATIVE   Leukocytes, UA SMALL (A) NEGATIVE   RBC / HPF 0-5 0 - 5 RBC/hpf   WBC, UA 0-5 0 - 5 WBC/hpf   Bacteria, UA NONE SEEN NONE SEEN   Squamous Epithelial / LPF 0-5 (A) NONE SEEN   Mucous PRESENT   Rapid urine drug screen (hospital performed)     Status: Abnormal   Collection Time: 08/08/16  8:10 PM  Result Value Ref Range   Opiates POSITIVE (A) NONE DETECTED   Cocaine NONE DETECTED NONE DETECTED   Benzodiazepines NONE DETECTED NONE DETECTED   Amphetamines NONE DETECTED NONE DETECTED   Tetrahydrocannabinol NONE DETECTED NONE DETECTED   Barbiturates NONE DETECTED NONE DETECTED    Comment:        DRUG SCREEN FOR MEDICAL PURPOSES ONLY.  IF CONFIRMATION IS NEEDED FOR ANY PURPOSE, NOTIFY LAB WITHIN 5 DAYS.        LOWEST DETECTABLE LIMITS FOR URINE DRUG SCREEN Drug Class       Cutoff (ng/mL) Amphetamine      1000 Barbiturate      200 Benzodiazepine   673 Tricyclics       419 Opiates          300 Cocaine          300 THC              50   CBC with Differential/Platelet     Status: None   Collection Time: 08/08/16  8:15 PM  Result Value Ref Range   WBC 9.1 4.0 - 10.5 K/uL   RBC 4.17 3.87 - 5.11 MIL/uL   Hemoglobin 12.4 12.0 - 15.0 g/dL   HCT 36.5 36.0 - 46.0 %   MCV 87.5 78.0 - 100.0 fL   MCH 29.7 26.0 - 34.0 pg   MCHC 34.0 30.0 - 36.0 g/dL   RDW 12.8 11.5 - 15.5 %   Platelets 273 150 - 400 K/uL   Neutrophils Relative % 78 %   Neutro Abs 7.1 1.7 - 7.7 K/uL   Lymphocytes Relative 16 %   Lymphs Abs 1.5 0.7 - 4.0 K/uL    Monocytes Relative 4 %   Monocytes Absolute 0.3 0.1 - 1.0 K/uL   Eosinophils Relative 2 %   Eosinophils Absolute 0.2 0.0 - 0.7 K/uL   Basophils Relative 0 %   Basophils Absolute 0.0 0.0 - 0.1 K/uL  Comprehensive metabolic  panel     Status: Abnormal   Collection Time: 08/08/16  8:15 PM  Result Value Ref Range   Sodium 138 135 - 145 mmol/L   Potassium 4.0 3.5 - 5.1 mmol/L   Chloride 106 101 - 111 mmol/L   CO2 25 22 - 32 mmol/L   Glucose, Bld 125 (H) 65 - 99 mg/dL   BUN 14 6 - 20 mg/dL   Creatinine, Ser 0.77 0.44 - 1.00 mg/dL   Calcium 9.0 8.9 - 10.3 mg/dL   Total Protein 6.7 6.5 - 8.1 g/dL   Albumin 3.9 3.5 - 5.0 g/dL   AST 19 15 - 41 U/L   ALT 19 14 - 54 U/L   Alkaline Phosphatase 76 38 - 126 U/L   Total Bilirubin 0.1 (L) 0.3 - 1.2 mg/dL   GFR calc non Af Amer >60 >60 mL/min   GFR calc Af Amer >60 >60 mL/min    Comment: (NOTE) The eGFR has been calculated using the CKD EPI equation. This calculation has not been validated in all clinical situations. eGFR's persistently <60 mL/min signify possible Chronic Kidney Disease.    Anion gap 7 5 - 15  hCG, serum, qualitative     Status: None   Collection Time: 08/08/16  8:15 PM  Result Value Ref Range   Preg, Serum NEGATIVE NEGATIVE    Comment:        THE SENSITIVITY OF THIS METHODOLOGY IS >10 mIU/mL.   Ethanol     Status: None   Collection Time: 08/08/16  8:15 PM  Result Value Ref Range   Alcohol, Ethyl (B) <5 <5 mg/dL    Comment:        LOWEST DETECTABLE LIMIT FOR SERUM ALCOHOL IS 5 mg/dL FOR MEDICAL PURPOSES ONLY   Acetaminophen level     Status: Abnormal   Collection Time: 08/08/16  8:15 PM  Result Value Ref Range   Acetaminophen (Tylenol), Serum <10 (L) 10 - 30 ug/mL    Comment:        THERAPEUTIC CONCENTRATIONS VARY SIGNIFICANTLY. A RANGE OF 10-30 ug/mL MAY BE AN EFFECTIVE CONCENTRATION FOR MANY PATIENTS. HOWEVER, SOME ARE BEST TREATED AT CONCENTRATIONS OUTSIDE THIS RANGE. ACETAMINOPHEN CONCENTRATIONS >150  ug/mL AT 4 HOURS AFTER INGESTION AND >50 ug/mL AT 12 HOURS AFTER INGESTION ARE OFTEN ASSOCIATED WITH TOXIC REACTIONS.   Salicylate level     Status: None   Collection Time: 08/08/16  8:15 PM  Result Value Ref Range   Salicylate Lvl <2.2 2.8 - 30.0 mg/dL    Current Facility-Administered Medications  Medication Dose Route Frequency Provider Last Rate Last Dose  . cefdinir (OMNICEF) 125 MG/5ML suspension 300 mg  300 mg Oral BID Ethelene Hal, NP   300 mg at 08/10/16 1017  . naloxone [facility]) injection 1 mg  1 mg Intravenous PRN Tanna Furry, MD   1 mg at 08/08/16 2028  . risperiDONE (RISPERDAL M-TABS) disintegrating tablet 2 mg  2 mg Oral Q8H PRN Orpah Greek, MD   2 mg at 08/09/16 0850   And  . ziprasidone (GEODON) injection 20 mg  20 mg Intramuscular PRN Orpah Greek, MD       Current Outpatient Prescriptions  Medication Sig Dispense Refill  . amphetamine-dextroamphetamine (ADDERALL) 20 MG tablet Take 20 mg by mouth 4 (four) times daily - after meals and at bedtime.     . cefdinir (OMNICEF) 300 MG capsule Take 300 mg by mouth 2 (two) times daily.     . clonazePAM (  KLONOPIN) 1 MG tablet Take 1 mg by mouth 3 (three) times daily as needed for anxiety.     Marland Kitchen HYDROcodone-acetaminophen (NORCO) 7.5-325 MG tablet Take 1 tablet by mouth every 6 (six) hours as needed for moderate pain.    Marland Kitchen oxyCODONE-acetaminophen (PERCOCET/ROXICET) 5-325 MG per tablet Take 1 tablet by mouth every 4 (four) hours as needed for severe pain. (Patient not taking: Reported on 08/08/2016) 15 tablet 0    Musculoskeletal: Strength & Muscle Tone: within normal limits Gait & Station: normal Patient leans: N/A  Psychiatric Specialty Exam: Physical Exam  Constitutional: She is oriented to person, place, and time. She appears well-developed and well-nourished.  HENT:  Head: Normocephalic.  Neck: Normal range of motion.  Cardiovascular: Normal rate.   Respiratory: Effort normal.   Musculoskeletal: Normal range of motion.  Neurological: She is alert and oriented to person, place, and time.  Psychiatric: Her speech is normal. Judgment and thought content normal. Her mood appears anxious. She is hyperactive. Cognition and memory are normal.    Review of Systems  Psychiatric/Behavioral: Positive for substance abuse. Negative for depression, hallucinations, memory Paul and suicidal ideas. The patient is nervous/anxious. The patient does not have insomnia.     Blood pressure 113/73, pulse 85, temperature 98.5 F (36.9 C), temperature source Oral, resp. rate 16, SpO2 98 %, unknown if currently breastfeeding.There is no height or weight on file to calculate BMI.  General Appearance: Casual  Eye Contact:  Good  Speech:  Clear and Coherent and Normal Rate  Volume:  Normal  Mood:  Anxious  Affect:  Congruent  Thought Process:  Coherent, Goal Directed and Linear  Orientation:  Full (Time, Place, and Person)  Thought Content:  Logical  Suicidal Thoughts:  No  Homicidal Thoughts:  No  Memory:  Immediate;   Good Recent;   Good Remote;   Fair  Judgement:  Good  Insight:  Fair  Psychomotor Activity:  Normal  Concentration:  Concentration: Fair and Attention Span: Fair  Recall:  Good  Fund of Knowledge:  Good  Language:  Good  Akathisia:  No  Handed:  Right  AIMS (if indicated):     Assets:  Communication Skills Desire for Improvement Financial Resources/Insurance Housing Physical Health Resilience Social Support Transportation Vocational/Educational  ADL's:  Intact  Cognition:  WNL  Sleep:        Treatment Plan Summary: Plan Discharge Home  Follow up with Dr Toy Care for outpatient psychiatry and medication management Follow up with PCP for any new or existing medical concerns Take all medications as prescribed Avoid the use of drugs and alcohol Do not take medication that is not prescribed to you.   Disposition: No evidence of imminent risk to self or  others at present.   Patient does not meet criteria for psychiatric inpatient admission. Supportive therapy provided about ongoing stressors. Discussed crisis plan, support from social network, calling 911, coming to the Emergency Department, and calling Suicide Hotline.  Ethelene Hal, NP 08/10/2016 12:19 PM  Patient seen face-to-face for psychiatric evaluation, chart reviewed and case discussed with the physician extender and developed treatment plan. Reviewed the information documented and agree with the treatment plan. Corena Pilgrim, MD

## 2016-08-10 NOTE — Discharge Instructions (Signed)
For your ongoing behavioral health needs, you are advised to continue treatment with Rupinder Kaur, MD: ° °     Rupinder Kaur, MD °     [address] °     [place], [place] [address] °     [phone] °

## 2016-08-10 NOTE — ED Notes (Signed)
De BurrsLaurie Park, NP consulted in regards to antibiotic order, awaiting order.

## 2016-08-10 NOTE — [facility] Suicide Risk Assessment (Signed)
Suicide Risk Assessment  Discharge Assessment   [facility]   Principal Problem: Adjustment disorder with disturbance of emotion Discharge Diagnoses:  Patient Active Problem List   Diagnosis Date Noted  . Adjustment disorder with disturbance of emotion [F43.29] 08/10/2016  . Opiate overdose [T40.601A]   . Oligohydramnios [O41.00X0] 07/04/2014    Total Time spent with patient: 30 minutes  Musculoskeletal: Strength & Muscle Tone: within normal limits Gait & Station: normal Patient leans: N/A Psychiatric Specialty Exam: Physical Exam  Constitutional: She is oriented to person, place, and time. She appears well-developed and well-nourished.  HENT:  Head: Normocephalic.  Neck: Normal range of motion.  Cardiovascular: Normal rate.   Respiratory: Effort normal.  Musculoskeletal: Normal range of motion.  Neurological: She is alert and oriented to person, place, and time.  Psychiatric: Her speech is normal. Judgment and thought content normal. Her mood appears anxious. She is hyperactive. Cognition and memory are normal.   Review of Systems  Psychiatric/Behavioral: Positive for substance abuse. Negative for depression, hallucinations, memory loss and suicidal ideas. The patient is nervous/anxious. The patient does not have insomnia.    Blood pressure 113/73, pulse 85, temperature 98.5 F (36.9 C), temperature source Oral, resp. rate 16, SpO2 98 %, unknown if currently breastfeeding.There is no height or weight on file to calculate BMI. General Appearance: Casual Eye Contact:  Good Speech:  Clear and Coherent and Normal Rate Volume:  Normal Mood:  Anxious Affect:  Congruent Thought Process:  Coherent, Goal Directed and Linear Orientation:  Full (Time, Place, and Person) Thought Content:  Logical Suicidal Thoughts:  No Homicidal Thoughts:  No Memory:  Immediate;   Good Recent;   Good Remote;   Fair Judgement:  Good Insight:  Fair Psychomotor  Activity:  Normal Concentration:  Concentration: Fair and Attention Span: Fair Recall:  Good Fund of Knowledge:  Good Language:  Good Akathisia:  No Handed:  Right AIMS (if indicated):    Assets:  Communication Skills Desire for Improvement Financial Resources/Insurance Housing Physical Health Resilience Social Support Transportation Vocational/Educational ADL's:  Intact Cognition:  WNL Sleep:     Mental Status Per Nursing Assessment::   On Admission:   somnolent  Demographic Factors:  NA  Loss Factors: NA  Historical Factors: Prior suicide attempts  Risk Reduction Factors:   Positive social support  Continued Clinical Symptoms:  Alcohol/Substance Abuse/Dependencies  Cognitive Features That Contribute To Risk:  None    Suicide Risk:  Minimal: No identifiable suicidal ideation.  Patients presenting with no risk factors but with morbid ruminations; may be classified as minimal risk based on the severity of the depressive symptoms    Plan Of Care/Follow-up recommendations:  Activity:  as tolerated Diet:  Heart Heathy  Laveda AbbeLaurie Britton Khara Renaud, NP 08/10/2016, 12:57 PM

## 2016-08-10 NOTE — ED Notes (Signed)
Pt taking a shower 

## 2016-08-10 NOTE — BH Assessment (Signed)
[facility] Assessment Progress Note  Per Thedore MinsMojeed Akintayo, MD, this pt does not require psychiatric hospitalization at this time.  [facility] to continue treatment with Milagros Evenerupinder Kaur, MD, her current outpatient psychiatrist.  This has been included in pt's discharge instructions.  Pt's nurse, Morrie Sheldonshley, has been notified.  Doylene Canninghomas Ersa Delaney, MA Triage Specialist [phone]

## 2016-08-10 NOTE — ED Notes (Signed)
Pt d/c home per MD order. Discharge summary reviewed with pt. RX provided. Pt denies SI/HI/AVH. Pt has no personal property. Pt signed e-signature. Ambulatory off unit with [facility].

## 2016-08-10 NOTE — ED Notes (Addendum)
Attempted several times all night to reach Dr. Wilkie AyeHorton for Antibiotic order. Last call was at 7 am. Twice, he told this writer he will call back but never did. Report to incoming nurse to follow up.

## 2016-11-11 LAB — HM PAP SMEAR: HM PAP: REACTIVE

## 2016-11-14 ENCOUNTER — Other Ambulatory Visit: Payer: Self-pay | Admitting: Obstetrics and Gynecology

## 2016-11-14 DIAGNOSIS — R928 Other abnormal and inconclusive findings on diagnostic imaging of breast: Secondary | ICD-10-CM

## 2016-11-16 ENCOUNTER — Other Ambulatory Visit: Payer: Self-pay

## 2016-11-21 ENCOUNTER — Other Ambulatory Visit: Payer: Self-pay

## 2016-12-20 ENCOUNTER — Other Ambulatory Visit: Payer: Self-pay

## 2016-12-28 ENCOUNTER — Other Ambulatory Visit: Payer: Self-pay

## 2017-01-24 ENCOUNTER — Other Ambulatory Visit: Payer: Self-pay

## 2017-02-21 ENCOUNTER — Ambulatory Visit
Admission: RE | Admit: 2017-02-21 | Discharge: 2017-02-21 | Disposition: A | Discharge status: Home / Self Care | Payer: 59 | Source: Ambulatory Visit | Attending: Obstetrics and Gynecology | Admitting: Obstetrics and Gynecology

## 2017-02-21 ENCOUNTER — Other Ambulatory Visit: Payer: Self-pay | Admitting: Obstetrics and Gynecology

## 2017-02-21 DIAGNOSIS — N6489 Other specified disorders of breast: Secondary | ICD-10-CM

## 2017-02-21 DIAGNOSIS — R928 Other abnormal and inconclusive findings on diagnostic imaging of breast: Secondary | ICD-10-CM

## 2017-02-21 LAB — HM MAMMOGRAPHY

## 2017-05-10 ENCOUNTER — Ambulatory Visit (INDEPENDENT_AMBULATORY_CARE_PROVIDER_SITE_OTHER): Payer: No Typology Code available for payment source | Admitting: Physician Assistant

## 2017-05-10 ENCOUNTER — Encounter: Payer: Self-pay | Admitting: Physician Assistant

## 2017-05-10 VITALS — BP 128/70 | HR 82 | Temp 98.4°F | Ht 66.0 in | Wt 153.4 lb

## 2017-05-10 DIAGNOSIS — F988 Other specified behavioral and emotional disorders with onset usually occurring in childhood and adolescence: Secondary | ICD-10-CM | POA: Insufficient documentation

## 2017-05-10 DIAGNOSIS — Z7689 Persons encountering health services in other specified circumstances: Secondary | ICD-10-CM | POA: Diagnosis not present

## 2017-05-10 DIAGNOSIS — H04301 Unspecified dacryocystitis of right lacrimal passage: Secondary | ICD-10-CM

## 2017-05-10 MED ORDER — ERYTHROMYCIN 5 MG/GM OP OINT: TOPICAL_OINTMENT | OPHTHALMIC | 0 refills | Status: DC

## 2017-05-10 MED ORDER — CLINDAMYCIN HCL 300 MG PO CAPS: 300.0000 mg | ORAL_CAPSULE | Freq: Three times a day (TID) | ORAL | 0 refills | Status: AC

## 2017-05-10 NOTE — Patient Instructions (Signed)
Start the oral antibiotic today. Take with food.  Start the eye ointment today.  Follow-up early next week for re-check.  Contact a health care provider if:  You have a fever.  Your eyelids become more red, warm, or swollen.  You have new symptoms.  Your symptoms do not get better with treatment. Get help right away if:  You develop double vision, or your vision becomes blurred or worsens in any way.  You have trouble moving your eyes.  Your eye looks like it is sticking out or bulging out (proptosis).  You develop a severe headache, severe neck pain, or neck stiffness.  You develop repeated vomiting. This information is not intended to replace advice given to you by your health care provider. Make sure you discuss any questions you have with your health care provider.
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
## 2017-05-11 ENCOUNTER — Encounter: Payer: Self-pay | Admitting: Physician Assistant

## 2017-05-16 ENCOUNTER — Ambulatory Visit: Payer: No Typology Code available for payment source | Admitting: Physician Assistant
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
## 2017-05-23 ENCOUNTER — Encounter: Payer: Self-pay | Admitting: Physician Assistant

## 2017-06-16 ENCOUNTER — Ambulatory Visit (INDEPENDENT_AMBULATORY_CARE_PROVIDER_SITE_OTHER): Payer: No Typology Code available for payment source | Admitting: Physician Assistant

## 2017-06-16 ENCOUNTER — Encounter: Payer: Self-pay | Admitting: Physician Assistant

## 2017-06-16 VITALS — BP 118/80 | HR 109 | Temp 100.0°F | Ht 66.0 in | Wt 143.5 lb

## 2017-06-16 DIAGNOSIS — J020 Streptococcal pharyngitis: Secondary | ICD-10-CM | POA: Diagnosis not present

## 2017-06-16 LAB — POCT RAPID STREP A (OFFICE): RAPID STREP A SCREEN: POSITIVE — AB

## 2017-06-16 MED ORDER — AZITHROMYCIN 250 MG PO TABS: ORAL_TABLET | ORAL | 0 refills | Status: DC
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
## 2017-06-16 NOTE — Patient Instructions (Addendum)
May take 800 mg ibuprofen every 8 hours. Take with food if possible.  PUSH FLUIDS.  START ANTIBIOTIC TODAY.  Get help right away if:  You have new symptoms, such as vomiting, severe headache, stiff or painful neck, chest pain, or shortness of breath.  You have severe throat pain, drooling, or changes in your voice.  You have swelling of the neck, or the skin on the neck becomes red and tender.  You have signs of dehydration, such as fatigue, dry mouth, and decreased urination.  You become increasingly sleepy, or you cannot wake up completely.  Your joints become red or painful. This information is not intended to replace advice given to you by your health care provider. Make sure you discuss any questions you have with your health care provider.

## 2017-08-22 ENCOUNTER — Other Ambulatory Visit: Payer: Self-pay | Admitting: Obstetrics and Gynecology

## 2017-08-22 DIAGNOSIS — N6489 Other specified disorders of breast: Secondary | ICD-10-CM

## 2017-08-29 ENCOUNTER — Ambulatory Visit
Admission: RE | Admit: 2017-08-29 | Discharge: 2017-08-29 | Disposition: A | Discharge status: Home / Self Care | Payer: No Typology Code available for payment source | Source: Ambulatory Visit | Attending: Obstetrics and Gynecology | Admitting: Obstetrics and Gynecology

## 2017-08-29 ENCOUNTER — Ambulatory Visit: Payer: Self-pay

## 2017-08-29 DIAGNOSIS — N6489 Other specified disorders of breast: Secondary | ICD-10-CM

## 2018-04-02 ENCOUNTER — Emergency Department (HOSPITAL_BASED_OUTPATIENT_CLINIC_OR_DEPARTMENT_OTHER): Payer: Self-pay

## 2018-04-02 ENCOUNTER — Other Ambulatory Visit: Payer: Self-pay

## 2018-04-02 ENCOUNTER — Encounter (HOSPITAL_BASED_OUTPATIENT_CLINIC_OR_DEPARTMENT_OTHER): Payer: Self-pay | Admitting: *Deleted

## 2018-04-02 DIAGNOSIS — X58XXXA Exposure to other specified factors, initial encounter: Secondary | ICD-10-CM | POA: Insufficient documentation

## 2018-04-02 DIAGNOSIS — Y999 Unspecified external cause status: Secondary | ICD-10-CM | POA: Insufficient documentation

## 2018-04-02 DIAGNOSIS — Z87891 Personal history of nicotine dependence: Secondary | ICD-10-CM | POA: Insufficient documentation

## 2018-04-02 DIAGNOSIS — Y929 Unspecified place or not applicable: Secondary | ICD-10-CM | POA: Insufficient documentation

## 2018-04-02 DIAGNOSIS — T189XXA Foreign body of alimentary tract, part unspecified, initial encounter: Secondary | ICD-10-CM | POA: Insufficient documentation

## 2018-04-02 DIAGNOSIS — Y9389 Activity, other specified: Secondary | ICD-10-CM | POA: Insufficient documentation

## 2018-04-02 NOTE — ED Triage Notes (Signed)
Pt reports that she was blowing up an exercise ball, states that she inhaled and swallowed the plastic cork from the ball. States that it is approximately 2 inches long and is pointed on the end. No respiratory distress noted, controlling secretions, no difficulty with talking.

## 2018-04-03 ENCOUNTER — Emergency Department (HOSPITAL_BASED_OUTPATIENT_CLINIC_OR_DEPARTMENT_OTHER)
Admission: EM | Admit: 2018-04-03 | Discharge: 2018-04-03 | Disposition: A | Discharge status: Home / Self Care | Payer: Self-pay | Attending: Emergency Medicine | Admitting: Emergency Medicine

## 2018-04-03 DIAGNOSIS — T189XXA Foreign body of alimentary tract, part unspecified, initial encounter: Secondary | ICD-10-CM

## 2018-04-03 NOTE — ED Notes (Signed)
Pt reports pain with swallowing and coughing. Pt able to talk in complete sentences. Voice sounds clear. Pt able to swallow and control secretions.

## 2018-04-03 NOTE — ED Provider Notes (Signed)
[facility] Provider Note: Lowella Dell, MD, FACEP  CSN: [id number] MRN: [id number] ARRIVAL: 04/02/18 at 2306 ROOM: MH04/MH04   CHIEF COMPLAINT  Swallowed Foreign Body   HISTORY OF PRESENT ILLNESS  04/03/18 1:51 AM [name] is a 43 y.o. female who was inflating her exercise ball about 11 PM and she accidentally swallowed with the plastic plug.  She states the plug was approximately 2 inches long and had a flange on the end.  She is having pain with swallowing but is able to swallow without difficulty.  She is having no respiratory difficulty.  She rates the pain with swallowing about an 8 out of 10.  She has not been vomiting.  Her abdomen is not distended.  She has a history of a bowel resection status post traumatic bowel injury.   History reviewed. No pertinent past medical history.  Past Surgical History:  Procedure Laterality Date  . APPENDECTOMY    . BOWEL RESECTION    . CESAREAN SECTION N/A 09/29/2012   Procedure: Primary CESAREAN SECTION  of baby boy at 1935 APGAR 9/9;  Surgeon: Turner Daniels, MD;  Location: [facility];  Service: Obstetrics;  Laterality: N/A;  . CESAREAN SECTION N/A 07/04/2014   Procedure: CESAREAN SECTION;  Surgeon: Harold Hedge, MD;  Location: [facility];  Service: Obstetrics;  Laterality: N/A;  . resection of bowel  2011  . TUBAL LIGATION      Family History  Problem Relation Age of Onset  . Hypertension Mother   . Arthritis Mother   . Hypertension Father   . Asthma Father   . Arthritis Father   . Diabetes Father   . Stroke Paternal Grandmother   . Colon cancer Neg Hx   . Breast cancer Neg Hx     Social History   Tobacco Use  . Smoking status: Former Smoker    Packs/day: 1.00    Years: 10.00    Pack years: 10.00    Types: Cigarettes    Last attempt to quit: 10/15/2016    Years since quitting: 1.4  . Smokeless tobacco: Never Used  Substance Use Topics  . Alcohol use: No  . Drug use: No    Prior to Admission medications    Medication Sig Start Date End Date Taking? Authorizing Provider  amphetamine-dextroamphetamine (ADDERALL) 20 MG tablet Take 20 mg by mouth 3 (three) times daily as needed.     [provider]  azithromycin (ZITHROMAX) 250 MG tablet 2 tablets on day 1, and then 1 tablet daily x 4 days 06/16/17   Jarold Motto, PA  clonazePAM (KLONOPIN) 1 MG tablet Take 1 mg by mouth 3 (three) times daily as needed for anxiety.  06/20/11   [provider]  ibuprofen (ADVIL,MOTRIN) 800 MG tablet TAKE 1 TABLET BY MOUTH EVERY 8 HOURS AS NEEDED FOR UP TO 14 DAYS FOR PAIN. 05/03/17   [provider]    Allergies Penicillins; Sulfa antibiotics; and Pseudoephedrine hcl   REVIEW OF SYSTEMS  Negative except as noted here or in the History of Present Illness.   PHYSICAL EXAMINATION  Initial Vital Signs Blood pressure 129/85, pulse (!) 113, temperature 98.1 F (36.7 C), temperature source Oral, resp. rate 18, height 5' 6.5" (1.689 m), weight 64.9 kg, last menstrual period 03/10/2018, SpO2 100 %.  Examination General: Well-developed, well-nourished female in no acute distress; appearance consistent with age of record HENT: normocephalic; atraumatic; pharynx normal Eyes: pupils equal, round and reactive to light; extraocular muscles intact Neck:  supple Heart: regular rate and rhythm Lungs: clear to auscultation bilaterally Abdomen: soft; nondistended; nontender; no masses or hepatosplenomegaly; bowel sounds present; well-healed vertical surgical incision Extremities: No deformity; full range of motion; pulses normal Neurologic: Awake, alert and oriented; motor function intact in all extremities and symmetric; no facial droop Skin: Warm and dry Psychiatric: Normal mood and affect   RESULTS  Summary of this visit's results, reviewed by myself:   EKG Interpretation  Date/Time:    Ventricular Rate:    PR Interval:    QRS Duration:   QT Interval:    QTC Calculation:   R Axis:       Text Interpretation:        Laboratory Studies: No results found for this or any previous visit (from the past 24 hour(s)). Imaging Studies: Dg Chest 2 View  Result Date: 04/02/2018 CLINICAL DATA:  Swallowed foreign body EXAM: CHEST - 2 VIEW COMPARISON:  None. FINDINGS: The heart size and mediastinal contours are within normal limits. Both lungs are clear. The visualized skeletal structures are unremarkable. IMPRESSION: No active cardiopulmonary disease. Electronically Signed   By: Jasmine Pang M.D.   On: 04/02/2018 23:53    ED COURSE and MDM  Nursing notes and initial vitals signs, including pulse oximetry, reviewed.  Vitals:   04/02/18 2312 04/02/18 2313  BP: 129/85   Pulse: (!) 113   Resp: 18   Temp: 98.1 F (36.7 C)   TempSrc: Oral   SpO2: 100%   Weight:  64.9 kg  Height:  5' 6.5" (1.689 m)   Chest radiograph is negative and there are no concerning findings on physical examination.  She was advised to be vigilant and return should she developed difficulty breathing, difficulty swallowing, abdominal distention, abdominal pain, persistent vomiting or vomiting of blood.  PROCEDURES    ED DIAGNOSES     ICD-10-CM   1. Swallowed foreign body, initial encounter T18.9XXA        Dea Bitting, MD 04/03/18 0200

## 2018-04-10 ENCOUNTER — Emergency Department (HOSPITAL_BASED_OUTPATIENT_CLINIC_OR_DEPARTMENT_OTHER)
Admission: EM | Admit: 2018-04-10 | Discharge: 2018-04-11 | Disposition: A | Discharge status: Home / Self Care | Payer: Medicaid Other | Attending: Emergency Medicine | Admitting: Emergency Medicine

## 2018-04-10 ENCOUNTER — Other Ambulatory Visit: Payer: Self-pay

## 2018-04-10 ENCOUNTER — Encounter (HOSPITAL_BASED_OUTPATIENT_CLINIC_OR_DEPARTMENT_OTHER): Payer: Self-pay | Admitting: *Deleted

## 2018-04-10 DIAGNOSIS — L0291 Cutaneous abscess, unspecified: Secondary | ICD-10-CM

## 2018-04-10 DIAGNOSIS — Z87891 Personal history of nicotine dependence: Secondary | ICD-10-CM | POA: Insufficient documentation

## 2018-04-10 MED ORDER — LIDOCAINE-EPINEPHRINE 2 %-1:100000 IJ SOLN
5.0000 mL | Freq: Once | INTRAMUSCULAR | Status: AC
  Administered 2018-04-10: 5 mL
  Filled 2018-04-10: qty 5.1

## 2018-04-10 MED ORDER — IBUPROFEN 800 MG PO TABS
800.0000 mg | ORAL_TABLET | Freq: Once | ORAL | Status: DC
  Filled 2018-04-10: qty 1

## 2018-04-10 MED ORDER — DOXYCYCLINE HYCLATE 100 MG PO TABS
100.0000 mg | ORAL_TABLET | Freq: Once | ORAL | Status: AC
  Administered 2018-04-10: 100 mg via ORAL
  Filled 2018-04-10: qty 1

## 2018-04-10 MED ORDER — LIDOCAINE-EPINEPHRINE (PF) 2 %-1:200000 IJ SOLN
INTRAMUSCULAR | Status: AC
  Filled 2018-04-10: qty 10

## 2018-04-10 MED ORDER — DOXYCYCLINE HYCLATE 100 MG PO CAPS: 100.0000 mg | ORAL_CAPSULE | Freq: Two times a day (BID) | ORAL | 0 refills | Status: DC

## 2018-04-10 NOTE — ED Notes (Signed)
ED Provider at bedside. 

## 2018-04-10 NOTE — ED Triage Notes (Signed)
Pt reports abscess to lower back since Saturday. Attempted to drain it herself and now it is more painful and she has a fever

## 2018-04-10 NOTE — ED Provider Notes (Signed)
[facility] EMERGENCY DEPARTMENT Provider Note   CSN: [id number] Arrival date & time: 04/10/18  2225    History   Chief Complaint Chief Complaint  Patient presents with  . Abscess    HPI [name] is a 43 y.o. female.     Patient presents to the emergency department for evaluation of abscess.  Patient has had a tender and swollen area on her lower back for the last several days.  She tried to squeeze it today but has not had any drainage.  She reports that after she squeezed it today she felt like she had a fever.  It is constant and worsening.     History reviewed. No pertinent past medical history.  Patient Active Problem List   Diagnosis Date Noted  . ADD (attention deficit disorder) 05/10/2017  . 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 Opiate overdose [facility])     Past Surgical History:  Procedure Laterality Date  . APPENDECTOMY    . BOWEL RESECTION    . CESAREAN SECTION N/A 09/29/2012   Procedure: Primary CESAREAN SECTION  of baby boy at 1935 APGAR 9/9;  Surgeon: Turner Daniels, MD;  Location: [facility];  Service: Obstetrics;  Laterality: N/A;  . CESAREAN SECTION N/A 07/04/2014   Procedure: CESAREAN SECTION;  Surgeon: Harold Hedge, MD;  Location: [facility];  Service: Obstetrics;  Laterality: N/A;  . resection of bowel  2011  . TUBAL LIGATION       OB History    Gravida  3   Para  2   Term  2   Preterm  0   AB  1   Living  2     SAB  0   TAB  1   Ectopic  0   Multiple  0   Live Births  2            Home Medications    Prior to Admission medications   Medication Sig Start Date End Date Taking? Authorizing Provider  amphetamine-dextroamphetamine (ADDERALL) 20 MG tablet Take 20 mg by mouth 3 (three) times daily as needed.     [provider]  clonazePAM (KLONOPIN) 1 MG tablet Take 1 mg by mouth 3 (three) times daily as needed for anxiety.  06/20/11   [provider]  doxycycline (VIBRAMYCIN)  100 MG capsule Take 1 capsule (100 mg total) by mouth 2 (two) times daily. 04/10/18   Gilda Crease, MD    Family History Family History  Problem Relation Age of Onset  . Hypertension Mother   . Arthritis Mother   . Hypertension Father   . Asthma Father   . Arthritis Father   . Diabetes Father   . Stroke Paternal Grandmother   . Colon cancer Neg Hx   . Breast cancer Neg Hx     Social History Social History   Tobacco Use  . Smoking status: Former Smoker    Packs/day: 1.00    Years: 10.00    Pack years: 10.00    Types: Cigarettes    Last attempt to quit: 10/15/2016    Years since quitting: 1.4  . Smokeless tobacco: Never Used  Substance Use Topics  . Alcohol use: No  . Drug use: No     Allergies   Penicillins; Sulfa antibiotics; and Pseudoephedrine hcl   Review of Systems Review of Systems  Constitutional: Positive for fever.  Skin: Positive for wound.  All other systems reviewed and are negative.  Physical Exam Updated Vital Signs BP (!) 114/102 (BP Location: Left Arm)   Pulse 88   Temp 98.3 F (36.8 C) (Oral)   Resp 16   Ht 5' 6.5" (1.689 m)   Wt 65.8 kg   LMP 04/05/2018   SpO2 100%   BMI 23.05 kg/m   Physical Exam Constitutional:      Appearance: Normal appearance.  Neck:     Musculoskeletal: Normal range of motion and neck supple.  Cardiovascular:     Rate and Rhythm: Regular rhythm.  Pulmonary:     Breath sounds: Normal breath sounds.  Musculoskeletal: Normal range of motion.  Skin:    Comments: 3 cm indurated area lower back with associated erythema, warmth and tenderness  Neurological:     General: No focal deficit present.     Mental Status: She is alert.      ED Treatments / Results  Labs (all labs ordered are listed, but only abnormal results are displayed) Labs Reviewed - No data to display  EKG None  Radiology No results found.  Procedures .Marland KitchenIncision and Drainage Date/Time: 04/10/2018 11:28 PM Performed by:  Gilda Crease, MD Authorized by: Gilda Crease, MD   Consent:    Consent obtained:  Verbal   Consent given by:  Patient   Risks discussed:  Incomplete drainage and pain Universal protocol:    Procedure explained and questions answered to patient or proxy's satisfaction: yes     Site/side marked: yes     Immediately prior to procedure a time out was called: yes     Patient identity confirmed:  Verbally with patient Location:    Type:  Abscess   Size:  2-3cm   Location:  Trunk   Trunk location:  Back Pre-procedure details:    Skin preparation:  Betadine Anesthesia (see MAR for exact dosages):    Anesthesia method:  Local infiltration   Local anesthetic:  Lidocaine 2% WITH epi Procedure type:    Complexity:  Simple Procedure details:    Needle aspiration: no     Incision types:  Stab incision   Scalpel blade:  11   Wound management:  Probed and deloculated   Drainage:  Bloody and purulent   Drainage amount:  Scant   Wound treatment:  Wound left open   Packing materials:  None Post-procedure details:    Patient tolerance of procedure:  Tolerated well, no immediate complications   (including critical care time)  Medications Ordered in ED Medications  lidocaine-EPINEPHrine (XYLOCAINE W/EPI) 2 %-1:200000 (PF) injection (has no administration in time range)  doxycycline (VIBRA-TABS) tablet 100 mg (has no administration in time range)  ibuprofen (ADVIL,MOTRIN) tablet 800 mg (has no administration in time range)  lidocaine-EPINEPHrine (XYLOCAINE W/EPI) 2 %-1:100000 (with pres) injection 5 mL (5 mLs Infiltration Given 04/10/18 2310)     Initial Impression / Assessment and Plan / ED Course  I have reviewed the triage vital signs and the nursing notes.  Pertinent labs & imaging results that were available during my care of the patient were reviewed by me and considered in my medical decision making (see chart for details).        Patient with evolving  abscess on her lower back.  There is associated induration and erythema.  She reports feverish feelings at home but is afebrile here.  She appears well, no signs of sepsis.  Vital signs are unremarkable.  Incision and drainage performed, initiate doxycycline.  Return precautions provided.  Final Clinical Impressions(s) /  ED Diagnoses   Final diagnoses:  Abscess    ED Discharge Orders         Ordered    doxycycline (VIBRAMYCIN) 100 MG capsule  2 times daily     04/10/18 2331           Gilda Crease, MD 04/10/18 [phone]

## 2020-02-16 ENCOUNTER — Other Ambulatory Visit: Payer: Self-pay

## 2020-02-16 ENCOUNTER — Emergency Department (HOSPITAL_BASED_OUTPATIENT_CLINIC_OR_DEPARTMENT_OTHER)
Admission: EM | Admit: 2020-02-16 | Discharge: 2020-02-16 | Disposition: A | Discharge status: Home / Self Care | Payer: Medicaid Other | Attending: Emergency Medicine | Admitting: Emergency Medicine

## 2020-02-16 ENCOUNTER — Encounter (HOSPITAL_BASED_OUTPATIENT_CLINIC_OR_DEPARTMENT_OTHER): Payer: Self-pay | Admitting: Emergency Medicine

## 2020-02-16 ENCOUNTER — Emergency Department (HOSPITAL_BASED_OUTPATIENT_CLINIC_OR_DEPARTMENT_OTHER): Payer: Medicaid Other

## 2020-02-16 DIAGNOSIS — Z87891 Personal history of nicotine dependence: Secondary | ICD-10-CM | POA: Insufficient documentation

## 2020-02-16 DIAGNOSIS — N12 Tubulo-interstitial nephritis, not specified as acute or chronic: Secondary | ICD-10-CM | POA: Insufficient documentation

## 2020-02-16 DIAGNOSIS — R1032 Left lower quadrant pain: Secondary | ICD-10-CM | POA: Diagnosis present

## 2020-02-16 DIAGNOSIS — N23 Unspecified renal colic: Secondary | ICD-10-CM

## 2020-02-16 HISTORY — DX: Calculus of kidney: N20.0

## 2020-02-16 LAB — CBC WITH DIFFERENTIAL/PLATELET
Abs Immature Granulocytes: 0.01 10*3/uL (ref 0.00–0.07)
Basophils Absolute: 0 10*3/uL (ref 0.0–0.1)
Basophils Relative: 0 %
Eosinophils Absolute: 0 10*3/uL (ref 0.0–0.5)
Eosinophils Relative: 0 %
HCT: 38.4 % (ref 36.0–46.0)
Hemoglobin: 14 g/dL (ref 12.0–15.0)
Immature Granulocytes: 0 %
Lymphocytes Relative: 14 %
Lymphs Abs: 1.1 10*3/uL (ref 0.7–4.0)
MCH: 31.4 pg (ref 26.0–34.0)
MCHC: 36.5 g/dL — ABNORMAL HIGH (ref 30.0–36.0)
MCV: 86.1 fL (ref 80.0–100.0)
Monocytes Absolute: 0.4 10*3/uL (ref 0.1–1.0)
Monocytes Relative: 6 %
Neutro Abs: 6 10*3/uL (ref 1.7–7.7)
Neutrophils Relative %: 80 %
Platelets: 436 10*3/uL — ABNORMAL HIGH (ref 150–400)
RBC: 4.46 MIL/uL (ref 3.87–5.11)
RDW: 11.4 % — ABNORMAL LOW (ref 11.5–15.5)
WBC: 7.6 10*3/uL (ref 4.0–10.5)
nRBC: 0 % (ref 0.0–0.2)

## 2020-02-16 LAB — URINALYSIS, ROUTINE W REFLEX MICROSCOPIC
Bilirubin Urine: NEGATIVE
Glucose, UA: NEGATIVE mg/dL
Hgb urine dipstick: NEGATIVE
Ketones, ur: 80 mg/dL — AB
Nitrite: NEGATIVE
Protein, ur: NEGATIVE mg/dL
Specific Gravity, Urine: 1.01 (ref 1.005–1.030)
pH: 8.5 — ABNORMAL HIGH (ref 5.0–8.0)

## 2020-02-16 LAB — PREGNANCY, URINE: Preg Test, Ur: NEGATIVE

## 2020-02-16 LAB — BASIC METABOLIC PANEL
Anion gap: 13 (ref 5–15)
BUN: 17 mg/dL (ref 6–20)
CO2: 17 mmol/L — ABNORMAL LOW (ref 22–32)
Calcium: 9.3 mg/dL (ref 8.9–10.3)
Chloride: 106 mmol/L (ref 98–111)
Creatinine, Ser: 1.61 mg/dL — ABNORMAL HIGH (ref 0.44–1.00)
GFR, Estimated: 41 mL/min — ABNORMAL LOW (ref >60)
Glucose, Bld: 137 mg/dL — ABNORMAL HIGH (ref 70–99)
Potassium: 3 mmol/L — ABNORMAL LOW (ref 3.5–5.1)
Sodium: 136 mmol/L (ref 135–145)

## 2020-02-16 LAB — URINALYSIS, MICROSCOPIC (REFLEX)

## 2020-02-16 MED ORDER — CIPROFLOXACIN IN D5W 400 MG/200ML IV SOLN
400.0000 mg | Freq: Once | INTRAVENOUS | Status: AC
  Administered 2020-02-16: 400 mg via INTRAVENOUS
  Filled 2020-02-16: qty 200

## 2020-02-16 MED ORDER — ONDANSETRON HCL 4 MG/2ML IJ SOLN
4.0000 mg | Freq: Once | INTRAMUSCULAR | Status: AC
  Administered 2020-02-16: 4 mg via INTRAVENOUS
  Filled 2020-02-16: qty 2

## 2020-02-16 MED ORDER — KETOROLAC TROMETHAMINE 15 MG/ML IJ SOLN
15.0000 mg | Freq: Once | INTRAMUSCULAR | Status: AC
  Administered 2020-02-16: 15 mg via INTRAVENOUS
  Filled 2020-02-16: qty 1

## 2020-02-16 MED ORDER — FENTANYL CITRATE (PF) 100 MCG/2ML IJ SOLN
100.0000 ug | Freq: Once | INTRAMUSCULAR | Status: AC
  Administered 2020-02-16: 100 ug via INTRAVENOUS
  Filled 2020-02-16: qty 2

## 2020-02-16 MED ORDER — SODIUM CHLORIDE 0.9 % IV SOLN: INTRAVENOUS | Status: DC | PRN

## 2020-02-16 MED ORDER — CIPROFLOXACIN HCL 500 MG PO TABS: 500.0000 mg | ORAL_TABLET | Freq: Two times a day (BID) | ORAL | 0 refills | Status: DC

## 2020-02-16 MED ORDER — POTASSIUM CHLORIDE CRYS ER 20 MEQ PO TBCR
40.0000 meq | EXTENDED_RELEASE_TABLET | Freq: Once | ORAL | Status: AC
  Administered 2020-02-16: 40 meq via ORAL
  Filled 2020-02-16: qty 2

## 2020-02-16 NOTE — ED Triage Notes (Addendum)
Reports left flank pain that started 30 min pta.  Denies any urinary symptoms but does endorse having kidney stones years ago.    Later in triage reports taking two delta 8 edibles about 2 hours ago.

## 2020-02-16 NOTE — ED Provider Notes (Signed)
[name], MD, FACEP  CSN: [id number] MRN: [id number] ARRIVAL: 02/16/20 at [place]: [place]  Flank Pain   HISTORY OF PRESENT ILLNESS  02/16/20 1:06 AM [name] is a 45 y.o. female with a history of kidney stones, urinary tract infections and bowel resection.  She is here with left flank pain radiating to her left lower quadrant that began about 30 minutes prior to arrival.  She rates the pain is a 10 out of 10 and describes it as sharp and throbbing in nature.  It is worse with movement or palpation.  She has not noticed any fever, chills, dysuria or hematuria with it.  She states when she gets urinary tract infections she rarely has symptoms until it affects her kidney(s).  She took two delta-8-THC edibles about 2 hours ago.  She takes these as needed for pain and inflammation.   Past Medical History:  Diagnosis Date  . Kidney stones     Past Surgical History:  Procedure Laterality Date  . APPENDECTOMY    . BOWEL RESECTION    . CESAREAN SECTION N/A 09/29/2012   Procedure: Primary CESAREAN SECTION  of baby boy at [place] 9/9;  Surgeon: Luz Lex, MD;  Location: [facility] [place] [facility];  Service: Obstetrics;  Laterality: N/A;  . CESAREAN SECTION N/A 07/04/2014   Procedure: CESAREAN SECTION;  Surgeon: Everlene Farrier, MD;  Location: [facility] [facility];  Service: Obstetrics;  Laterality: N/A;  . resection of bowel  2011  . TUBAL LIGATION      Family History  Problem Relation Age of Onset  . Hypertension Mother   . Arthritis Mother   . Hypertension Father   . Asthma Father   . Arthritis Father   . Diabetes Father   . Stroke Paternal Grandmother   . Colon cancer Neg Hx   . Breast cancer Neg Hx     Social History   Tobacco Use  . Smoking status: Former Smoker    Packs/day: 1.00    Years: 10.00    Pack years: 10.00    Types: Cigarettes    Quit date: 10/15/2016    Years since quitting: 3.3  . Smokeless tobacco: Never Used   Vaping Use  . Vaping Use: Some days  Substance Use Topics  . Alcohol use: No  . Drug use: No    Prior to Admission medications   Medication Sig Start Date End Date Taking? Authorizing Provider  ciprofloxacin (CIPRO) 500 MG tablet Take 1 tablet (500 mg total) by mouth 2 (two) times daily. One po bid x 7 days 02/16/20  Yes Tiawanna Luchsinger, MD  amphetamine-dextroamphetamine (ADDERALL) 20 MG tablet Take 20 mg by mouth 3 (three) times daily as needed.     [provider]  clonazePAM (KLONOPIN) 1 MG tablet Take 1 mg by mouth 3 (three) times daily as needed for anxiety.  06/20/11   [provider]    Allergies Penicillins, Sulfa antibiotics, and Pseudoephedrine hcl   REVIEW OF SYSTEMS  Negative except as noted here or in the History of Present Illness.   PHYSICAL EXAMINATION  Initial Vital Signs Blood pressure 99/77, pulse (!) 114, temperature 98.3 F (36.8 C), temperature source Oral, resp. rate 18, height 5' 6.5" (1.689 m), weight 68 kg, SpO2 99 %.  Examination General: Well-developed, well-nourished female in no acute distress; appearance consistent with age of record HENT: normocephalic; atraumatic Eyes: pupils equal, round and reactive to light; extraocular muscles intact  Neck: supple Heart: regular rate and rhythm Lungs: clear to auscultation bilaterally Abdomen: soft; nondistended; nontender; bowel sounds present GU: Left CVA tenderness Extremities: No deformity; full range of motion; pulses normal Neurologic: Awake, alert and oriented; motor function intact in all extremities and symmetric; no facial droop Skin: Warm and dry Psychiatric: Grimacing   RESULTS  Summary of this visit's results, reviewed and interpreted by myself:   EKG Interpretation  Date/Time:    Ventricular Rate:    PR Interval:    QRS Duration:   QT Interval:    QTC Calculation:   R Axis:     Text Interpretation:        Laboratory Studies: Results for orders placed or performed  during the hospital encounter of 02/16/20 (from the past 24 hour(s))  Urinalysis, Routine w reflex microscopic Urine, Clean Catch     Status: Abnormal   Collection Time: 02/16/20 12:15 AM  Result Value Ref Range   Color, Urine YELLOW YELLOW   APPearance HAZY (A) CLEAR   Specific Gravity, Urine 1.010 1.005 - 1.030   pH 8.5 (H) 5.0 - 8.0   Glucose, UA NEGATIVE NEGATIVE mg/dL   Hgb urine dipstick NEGATIVE NEGATIVE   Bilirubin Urine NEGATIVE NEGATIVE   Ketones, ur 80 (A) NEGATIVE mg/dL   Protein, ur NEGATIVE NEGATIVE mg/dL   Nitrite NEGATIVE NEGATIVE   Leukocytes,Ua MODERATE (A) NEGATIVE  Pregnancy, urine     Status: None   Collection Time: 02/16/20 12:15 AM  Result Value Ref Range   Preg Test, Ur NEGATIVE NEGATIVE  Urinalysis, Microscopic (reflex)     Status: Abnormal   Collection Time: 02/16/20 12:15 AM  Result Value Ref Range   RBC / HPF 0-5 0 - 5 RBC/hpf   WBC, UA 21-50 0 - 5 WBC/hpf   Bacteria, UA MANY (A) NONE SEEN   Squamous Epithelial / LPF 11-20 0 - 5  CBC with Differential     Status: Abnormal   Collection Time: 02/16/20 12:24 AM  Result Value Ref Range   WBC 7.6 4.0 - 10.5 K/uL   RBC 4.46 3.87 - 5.11 MIL/uL   Hemoglobin 14.0 12.0 - 15.0 g/dL   HCT 98.9 21.1 - 94.1 %   MCV 86.1 80.0 - 100.0 fL   MCH 31.4 26.0 - 34.0 pg   MCHC 36.5 (H) 30.0 - 36.0 g/dL   RDW 74.0 (L) 81.4 - 48.1 %   Platelets 436 (H) 150 - 400 K/uL   nRBC 0.0 0.0 - 0.2 %   Neutrophils Relative % 80 %   Neutro Abs 6.0 1.7 - 7.7 K/uL   Lymphocytes Relative 14 %   Lymphs Abs 1.1 0.7 - 4.0 K/uL   Monocytes Relative 6 %   Monocytes Absolute 0.4 0.1 - 1.0 K/uL   Eosinophils Relative 0 %   Eosinophils Absolute 0.0 0.0 - 0.5 K/uL   Basophils Relative 0 %   Basophils Absolute 0.0 0.0 - 0.1 K/uL   Immature Granulocytes 0 %   Abs Immature Granulocytes 0.01 0.00 - 0.07 K/uL  Basic metabolic panel     Status: Abnormal   Collection Time: 02/16/20 12:24 AM  Result Value Ref Range   Sodium 136 135 - 145  mmol/L   Potassium 3.0 (L) 3.5 - 5.1 mmol/L   Chloride 106 98 - 111 mmol/L   CO2 17 (L) 22 - 32 mmol/L   Glucose, Bld 137 (H) 70 - 99 mg/dL   BUN 17 6 - 20 mg/dL   Creatinine, Ser  1.61 (H) 0.44 - 1.00 mg/dL   Calcium 9.3 8.9 - 42.3 mg/dL   GFR, Estimated 41 (L) >60 mL/min   Anion gap 13 5 - 15   Imaging Studies: CT Renal Stone Study  Result Date: 02/16/2020 CLINICAL DATA:  Left flank pain EXAM: CT ABDOMEN AND PELVIS WITHOUT CONTRAST TECHNIQUE: Multidetector CT imaging of the abdomen and pelvis was performed following the standard protocol without IV contrast. COMPARISON:  09/08/2014 FINDINGS: Lower chest: No acute abnormality. Hepatobiliary: Mild focal fatty hepatic infiltration adjacent the falciform ligament. Liver otherwise unremarkable. Gallbladder unremarkable. No intra or extrahepatic biliary ductal dilation. Pancreas: Unremarkable Spleen: Unremarkable Adrenals/Urinary Tract: The adrenal glands are unremarkable. The kidneys are normal in size and position. There is mild left hydronephrosis and hydroureter to the level of the ureterovesicular junction. A 3 mm calculus is seen laying dependently within the bladder lumen, likely reflecting a recently passed left ureteral calculus. Two separate 1-2 mm nonobstructing calculi are seen within the lower pole of the left kidney. No nephro or urolithiasis on the right. No hydronephrosis on the right. The bladder is otherwise unremarkable. Stomach/Bowel: Partial small bowel resection has been performed involving the a proximal jejunum just beyond the ligament of Treitz. The large and small bowel are otherwise unremarkable. Stomach unremarkable. No evidence of obstruction or focal inflammation. Appendectomy has been performed. No free intraperitoneal gas. Trace free fluid within the pelvis is likely physiologic in a female patient of this age. Vascular/Lymphatic: No significant vascular findings are present. No enlarged abdominal or pelvic lymph nodes.  Reproductive: Uterus and bilateral adnexa are unremarkable. Other: Rectum unremarkable.  No abdominal wall hernia. Musculoskeletal: Osseous structures are age-appropriate. No acute bone abnormality. IMPRESSION: 3 mm calculus within the bladder lumen, likely representing a recently passed left ureteral calculus with mild residual left hydronephrosis and hydroureter. Superimposed minimal left nonobstructing nephrolithiasis. Electronically Signed   By: Helyn Numbers MD   On: 02/16/2020 01:29    ED COURSE and MDM  Nursing notes, initial and subsequent vitals signs, including pulse oximetry, reviewed and interpreted by myself.  Vitals:   02/16/20 0010 02/16/20 0011  BP: 99/77   Pulse: (!) 114   Resp: 18   Temp: 98.3 F (36.8 C)   TempSrc: Oral   SpO2: 99%   Weight:  68 kg  Height:  5' 6.5" (1.689 m)   Medications  ciprofloxacin (CIPRO) IVPB 400 mg (400 mg Intravenous New Bag/Given 02/16/20 0206)  0.9 %  sodium chloride infusion ( Intravenous New Bag/Given 02/16/20 0152)  potassium chloride SA (KLOR-CON) CR tablet 40 mEq (has no administration in time range)  ondansetron (ZOFRAN) injection 4 mg (4 mg Intravenous Given 02/16/20 0152)  fentaNYL (SUBLIMAZE) injection 100 mcg (100 mcg Intravenous Given 02/16/20 0157)  ketorolac (TORADOL) 15 MG/ML injection 15 mg (15 mg Intravenous Given 02/16/20 0155)   1:35 AM The patient is still having flank pain but the CT shows what appears to be a stone in the bladder having recently been passed.  Given the patient's pyuria urine has been sent for culture we will start her on Cipro; as she has an anaphylactic reaction to penicillins we will avoid Rocephin.  2:22 AM Pain significantly improved after IV fentanyl and Toradol.   PROCEDURES  Procedures   ED DIAGNOSES     ICD-10-CM   1. Ureteral colic  N23   2. Pyelonephritis  N12        Camauri Fleece, MD 02/16/20 [phone]

## 2020-02-18 LAB — URINE CULTURE: Culture: 40000 — AB

## 2020-02-27 DIAGNOSIS — Z1152 Encounter for screening for COVID-19: Secondary | ICD-10-CM | POA: Diagnosis not present

## 2020-03-05 DIAGNOSIS — Z1152 Encounter for screening for COVID-19: Secondary | ICD-10-CM | POA: Diagnosis not present

## 2020-03-09 ENCOUNTER — Other Ambulatory Visit: Payer: Medicaid Other

## 2020-03-09 DIAGNOSIS — Z20822 Contact with and (suspected) exposure to covid-19: Secondary | ICD-10-CM | POA: Diagnosis not present

## 2020-03-11 LAB — SARS-COV-2, NAA 2 DAY TAT

## 2020-03-11 LAB — NOVEL CORONAVIRUS, NAA: SARS-CoV-2, NAA: NOT DETECTED

## 2020-10-31 ENCOUNTER — Other Ambulatory Visit: Payer: Self-pay

## 2020-10-31 ENCOUNTER — Encounter (HOSPITAL_BASED_OUTPATIENT_CLINIC_OR_DEPARTMENT_OTHER): Payer: Self-pay | Admitting: Emergency Medicine

## 2020-10-31 ENCOUNTER — Emergency Department (HOSPITAL_BASED_OUTPATIENT_CLINIC_OR_DEPARTMENT_OTHER)
Admission: EM | Admit: 2020-10-31 | Discharge: 2020-10-31 | Disposition: A | Discharge status: Home / Self Care | Payer: PRIVATE HEALTH INSURANCE | Attending: Emergency Medicine | Admitting: Emergency Medicine

## 2020-10-31 ENCOUNTER — Emergency Department (HOSPITAL_BASED_OUTPATIENT_CLINIC_OR_DEPARTMENT_OTHER): Payer: PRIVATE HEALTH INSURANCE

## 2020-10-31 ENCOUNTER — Emergency Department (HOSPITAL_BASED_OUTPATIENT_CLINIC_OR_DEPARTMENT_OTHER): Payer: PRIVATE HEALTH INSURANCE | Admitting: Radiology

## 2020-10-31 DIAGNOSIS — T7411XA Adult physical abuse, confirmed, initial encounter: Secondary | ICD-10-CM | POA: Insufficient documentation

## 2020-10-31 DIAGNOSIS — S0083XA Contusion of other part of head, initial encounter: Secondary | ICD-10-CM

## 2020-10-31 DIAGNOSIS — Z79899 Other long term (current) drug therapy: Secondary | ICD-10-CM | POA: Diagnosis not present

## 2020-10-31 DIAGNOSIS — S6991XA Unspecified injury of right wrist, hand and finger(s), initial encounter: Secondary | ICD-10-CM | POA: Diagnosis present

## 2020-10-31 DIAGNOSIS — Y906 Blood alcohol level of 120-199 mg/100 ml: Secondary | ICD-10-CM | POA: Diagnosis not present

## 2020-10-31 DIAGNOSIS — F10920 Alcohol use, unspecified with intoxication, uncomplicated: Secondary | ICD-10-CM | POA: Diagnosis not present

## 2020-10-31 DIAGNOSIS — Z87891 Personal history of nicotine dependence: Secondary | ICD-10-CM | POA: Insufficient documentation

## 2020-10-31 DIAGNOSIS — S62326A Displaced fracture of shaft of fifth metacarpal bone, right hand, initial encounter for closed fracture: Secondary | ICD-10-CM | POA: Diagnosis not present

## 2020-10-31 DIAGNOSIS — S0993XA Unspecified injury of face, initial encounter: Secondary | ICD-10-CM | POA: Diagnosis not present

## 2020-10-31 LAB — ETHANOL: Alcohol, Ethyl (B): 170 mg/dL — ABNORMAL HIGH (ref <10)

## 2020-10-31 MED ORDER — KETOROLAC TROMETHAMINE 15 MG/ML IJ SOLN
15.0000 mg | Freq: Once | INTRAMUSCULAR | Status: AC
  Administered 2020-10-31: 15 mg via INTRAVENOUS

## 2020-10-31 MED ORDER — KETOROLAC TROMETHAMINE 15 MG/ML IJ SOLN
15.0000 mg | Freq: Once | INTRAMUSCULAR | Status: AC
  Administered 2020-10-31: 15 mg via INTRAVENOUS
  Filled 2020-10-31: qty 1

## 2020-10-31 MED ORDER — BUTORPHANOL TARTRATE 2 MG/ML IJ SOLN
2.0000 mg | Freq: Once | INTRAMUSCULAR | Status: AC
  Administered 2020-10-31: 2 mg via INTRAVENOUS
  Filled 2020-10-31: qty 1

## 2020-10-31 MED ORDER — IBUPROFEN 800 MG PO TABS: 800.0000 mg | ORAL_TABLET | Freq: Three times a day (TID) | ORAL | 0 refills | Status: AC | PRN
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
## 2020-11-02 DIAGNOSIS — F4310 Post-traumatic stress disorder, unspecified: Secondary | ICD-10-CM | POA: Diagnosis not present

## 2020-11-11 DIAGNOSIS — M79641 Pain in right hand: Secondary | ICD-10-CM | POA: Diagnosis not present

## 2020-11-11 DIAGNOSIS — S62306A Unspecified fracture of fifth metacarpal bone, right hand, initial encounter for closed fracture: Secondary | ICD-10-CM | POA: Diagnosis not present

## 2020-11-12 DIAGNOSIS — F4310 Post-traumatic stress disorder, unspecified: Secondary | ICD-10-CM | POA: Diagnosis not present

## 2020-11-24 DIAGNOSIS — F4310 Post-traumatic stress disorder, unspecified: Secondary | ICD-10-CM | POA: Diagnosis not present

## 2020-11-25 DIAGNOSIS — M79641 Pain in right hand: Secondary | ICD-10-CM | POA: Diagnosis not present

## 2020-11-25 DIAGNOSIS — S62306D Unspecified fracture of fifth metacarpal bone, right hand, subsequent encounter for fracture with routine healing: Secondary | ICD-10-CM | POA: Diagnosis not present

## 2020-11-26 DIAGNOSIS — F4310 Post-traumatic stress disorder, unspecified: Secondary | ICD-10-CM | POA: Diagnosis not present

## 2020-12-01 DIAGNOSIS — F4310 Post-traumatic stress disorder, unspecified: Secondary | ICD-10-CM | POA: Diagnosis not present

## 2020-12-14 DIAGNOSIS — M79641 Pain in right hand: Secondary | ICD-10-CM | POA: Diagnosis not present

## 2020-12-14 DIAGNOSIS — S62306D Unspecified fracture of fifth metacarpal bone, right hand, subsequent encounter for fracture with routine healing: Secondary | ICD-10-CM | POA: Diagnosis not present

## 2020-12-18 DIAGNOSIS — F4310 Post-traumatic stress disorder, unspecified: Secondary | ICD-10-CM | POA: Diagnosis not present

## 2020-12-25 DIAGNOSIS — F4310 Post-traumatic stress disorder, unspecified: Secondary | ICD-10-CM | POA: Diagnosis not present

## 2020-12-29 DIAGNOSIS — F4310 Post-traumatic stress disorder, unspecified: Secondary | ICD-10-CM | POA: Diagnosis not present

## 2021-01-04 DIAGNOSIS — S62306D Unspecified fracture of fifth metacarpal bone, right hand, subsequent encounter for fracture with routine healing: Secondary | ICD-10-CM | POA: Diagnosis not present

## 2021-01-04 DIAGNOSIS — M79641 Pain in right hand: Secondary | ICD-10-CM | POA: Diagnosis not present

## 2021-01-19 DIAGNOSIS — F4310 Post-traumatic stress disorder, unspecified: Secondary | ICD-10-CM | POA: Diagnosis not present

## 2021-01-27 DIAGNOSIS — F4310 Post-traumatic stress disorder, unspecified: Secondary | ICD-10-CM | POA: Diagnosis not present

## 2021-02-02 DIAGNOSIS — F4310 Post-traumatic stress disorder, unspecified: Secondary | ICD-10-CM | POA: Diagnosis not present

## 2021-02-16 DIAGNOSIS — F4310 Post-traumatic stress disorder, unspecified: Secondary | ICD-10-CM | POA: Diagnosis not present

## 2022-02-05 IMAGING — DX DG HAND COMPLETE 3+V*R*
3 series · 3 of 3 positions shown · non-contrast
Comparison: No prior right hand radiographs. Correlation is made
with right wrist radiographs 01/02/2008

CLINICAL DATA: Injury, pain, hematoma

EXAM:
RIGHT HAND - COMPLETE VIEW

[hand ap]
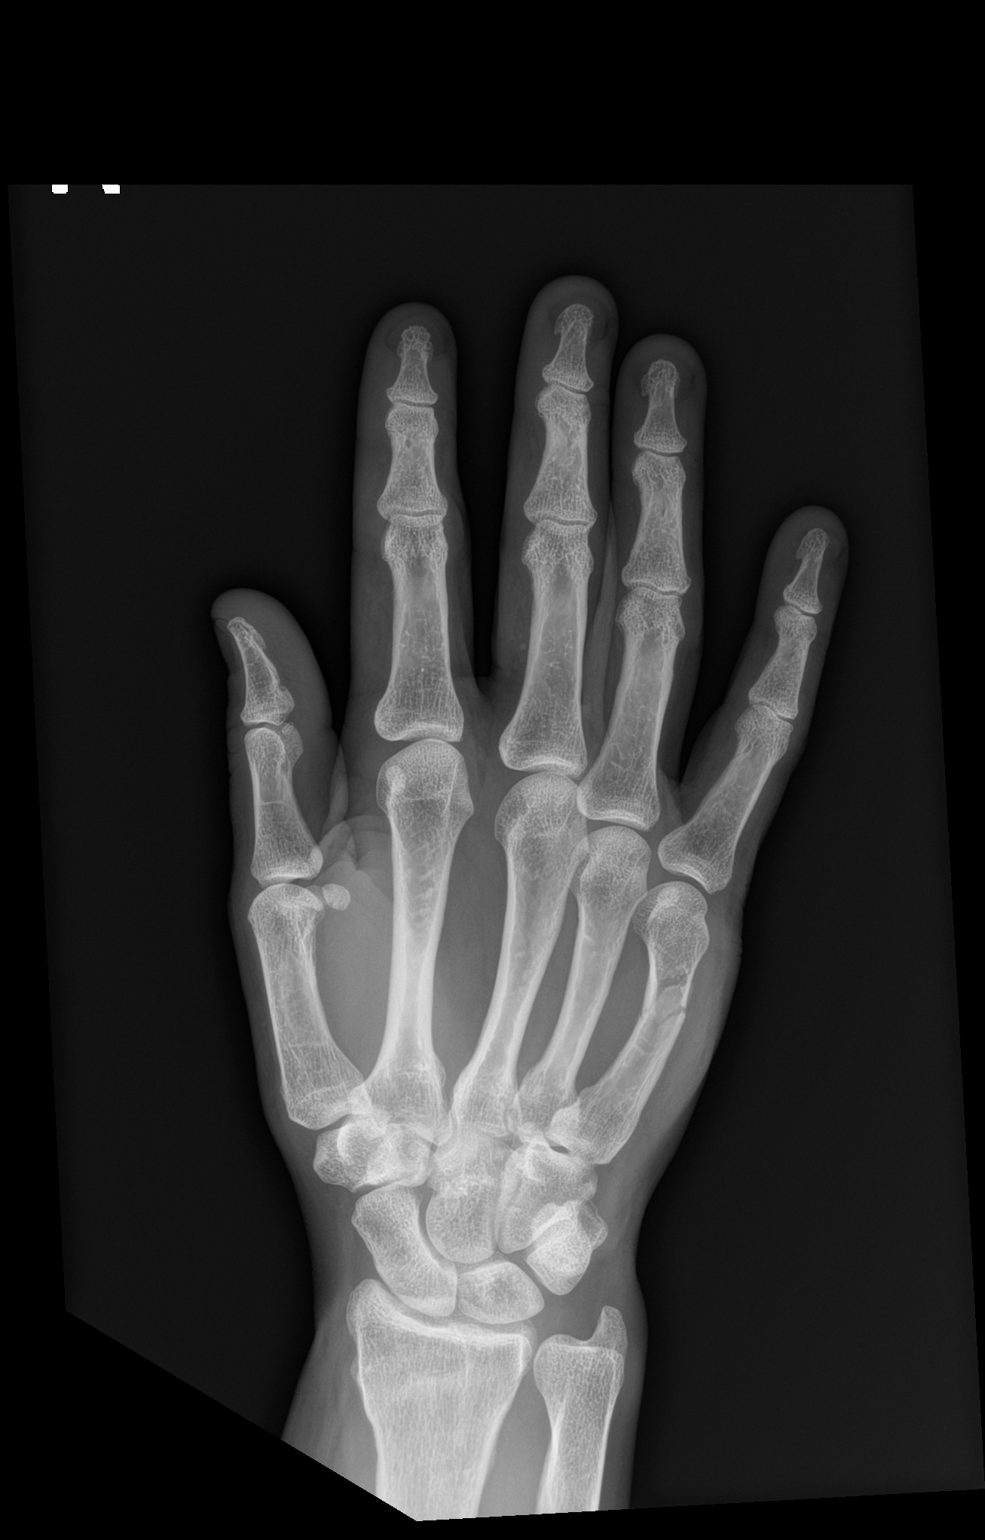

[hand obl]
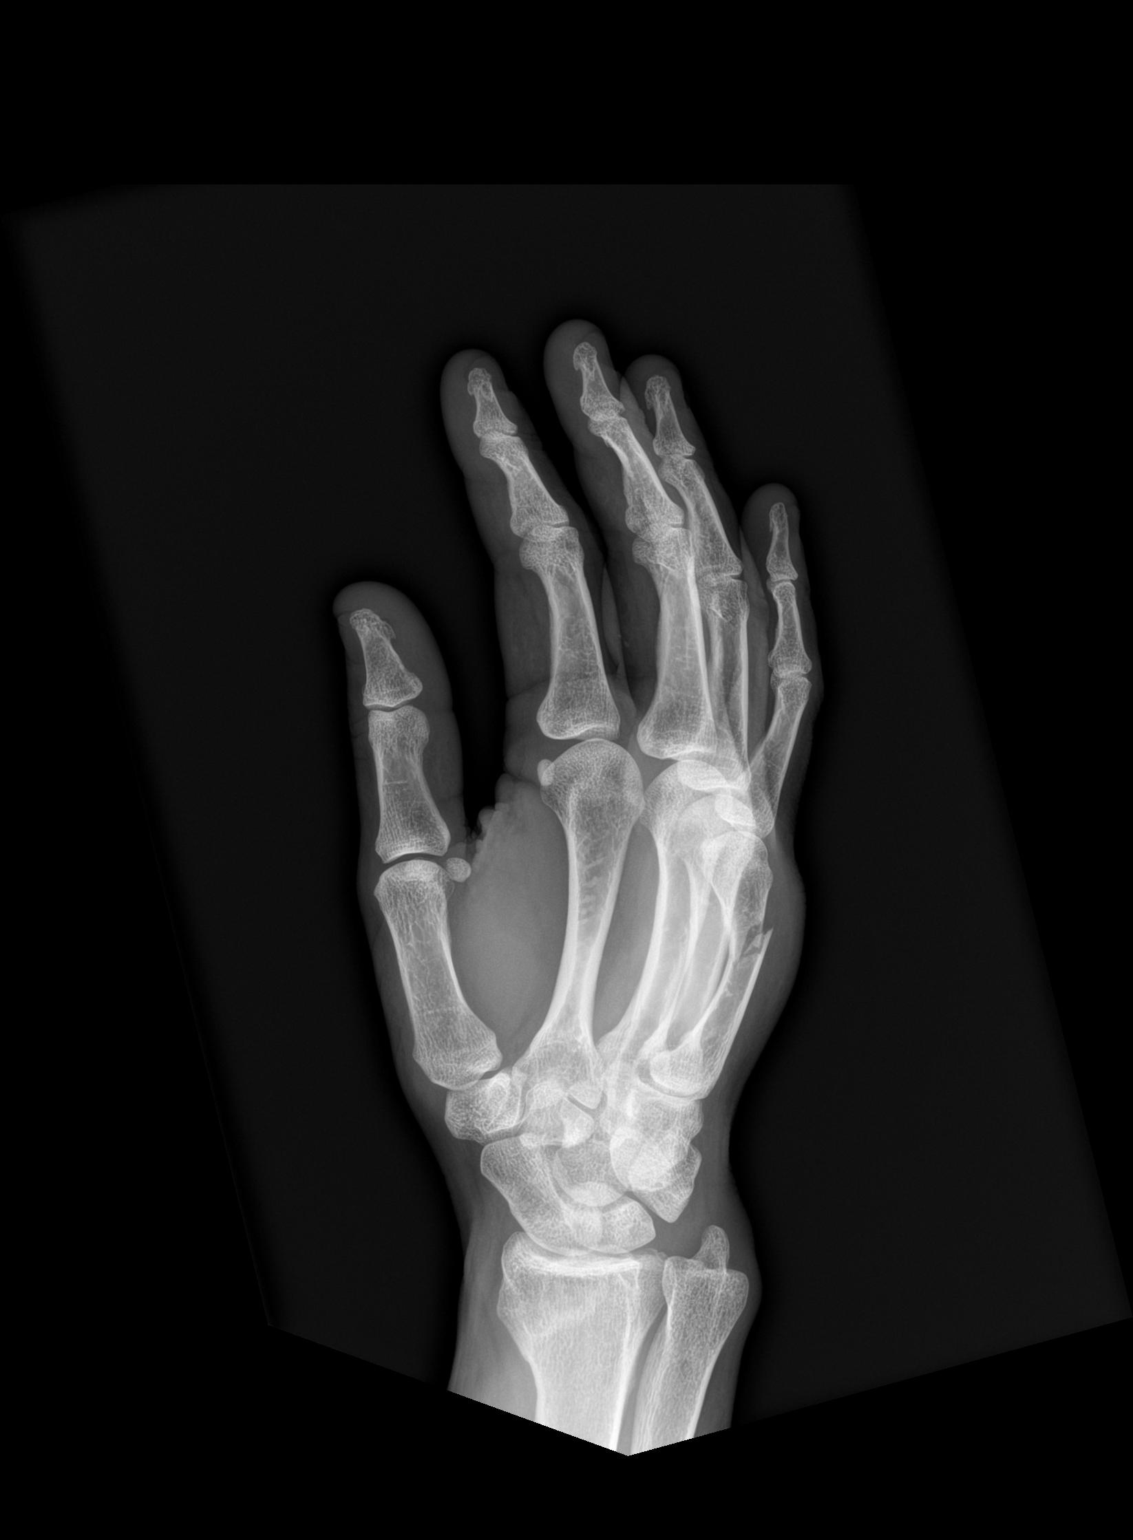

[hand lat]
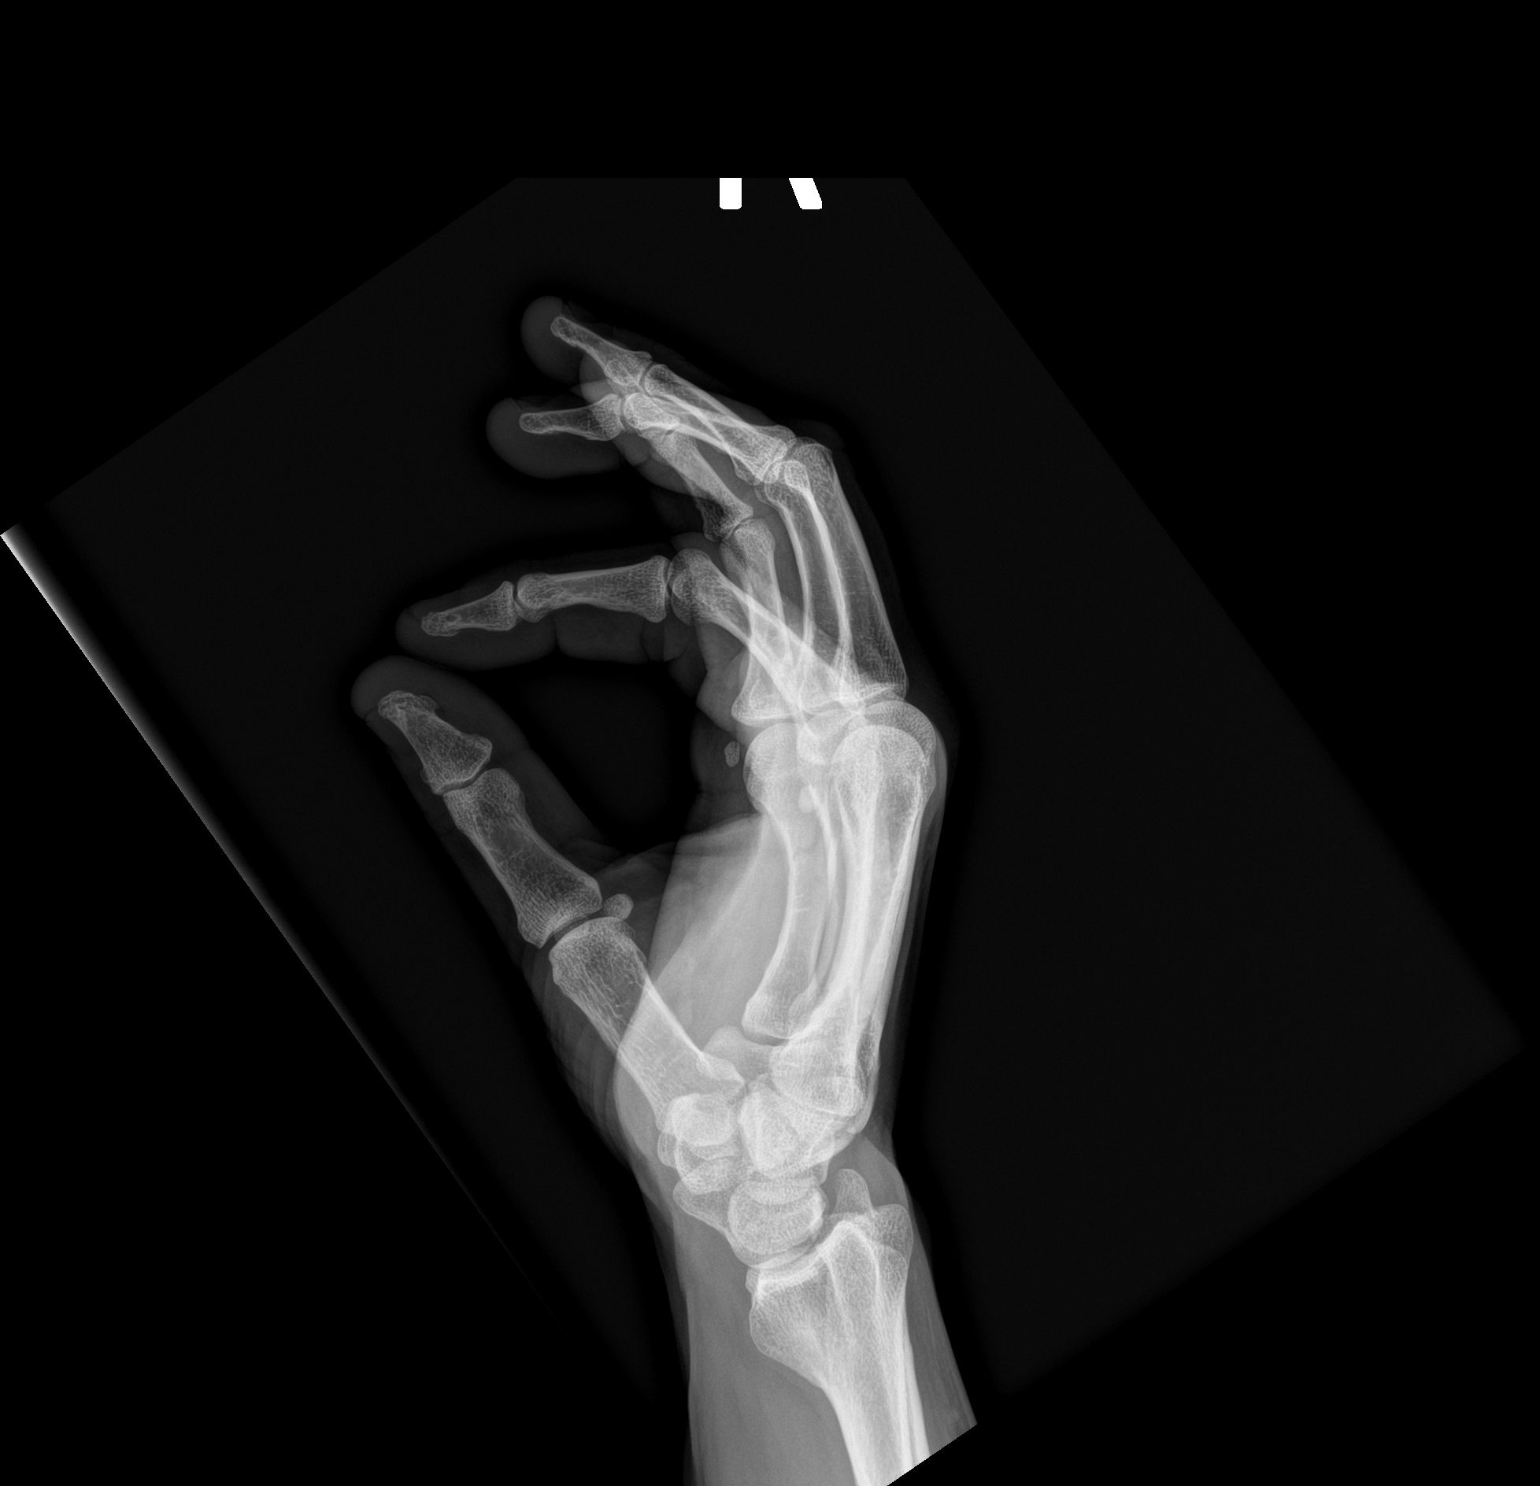

[3 of 3 positions shown; findings below may reference images not displayed]

FINDINGS: Transverse, mildly comminuted fracture through the diaphysis of the
fifth metacarpal, mildly displaced, with mild volar angulation of
the distal fracture fragment. No other acute fracture is seen. Soft
tissue swelling about the ulnar aspect of the hand
IMPRESSION: Mildly displaced fracture through the diaphysis of the fifth
metacarpal.

## 2022-02-05 IMAGING — CT CT MAXILLOFACIAL W/O CM
3 series · 16 of 47 positions shown, 19 images · non-contrast
Comparison: None.

CLINICAL DATA: Facial trauma

EXAM:
CT MAXILLOFACIAL WITHOUT CONTRAST
TECHNIQUE: Multidetector CT imaging of the maxillofacial structures was
performed. Multiplanar CT image reconstructions were also generated.

[Series 5: 1 max soft · axial · 0.32mm/px · z∈[+66,+212]mm · 10 of 85 slices shown, 13 images]
[im 6/85  brain]
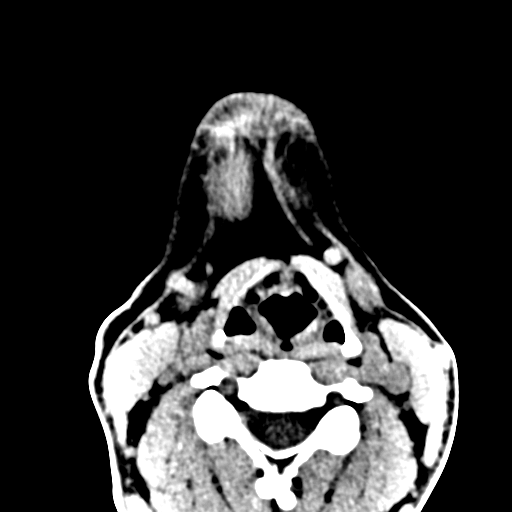
[im 6/85  bone]
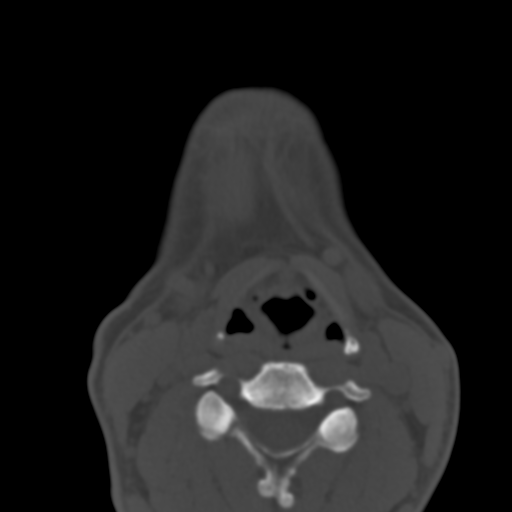
[im 15/85  bone]
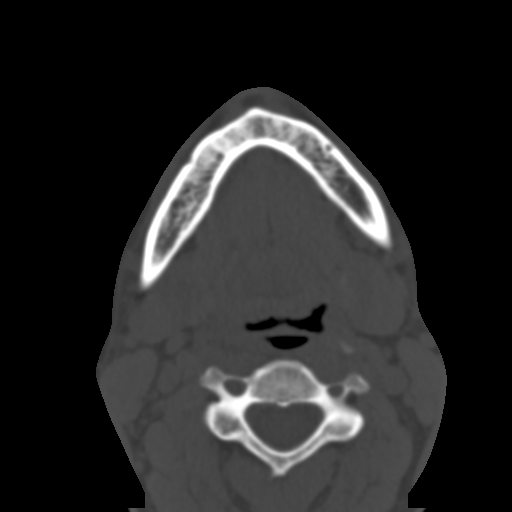
[im 24/85  bone]
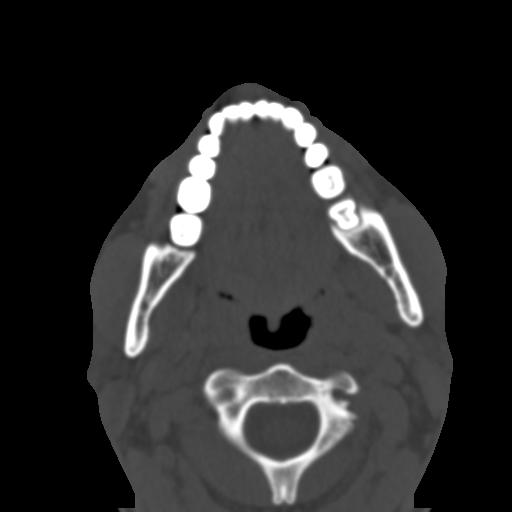
[im 29/85  bone]
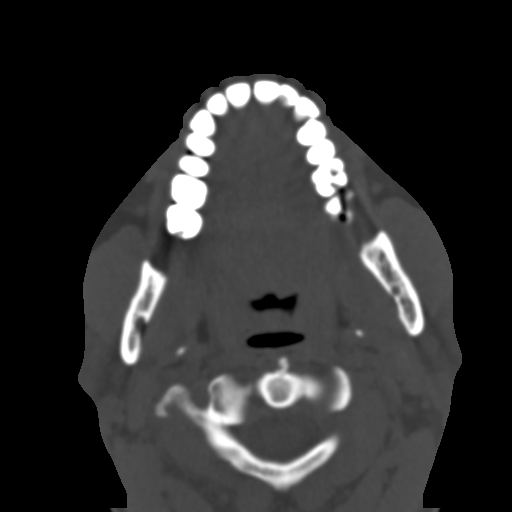
[im 38/85  brain]
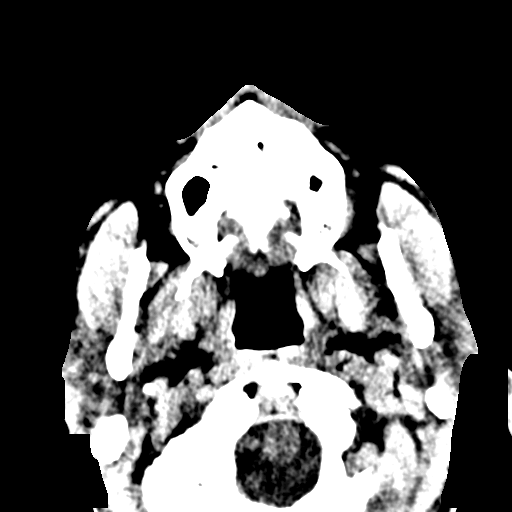
[im 38/85  bone]
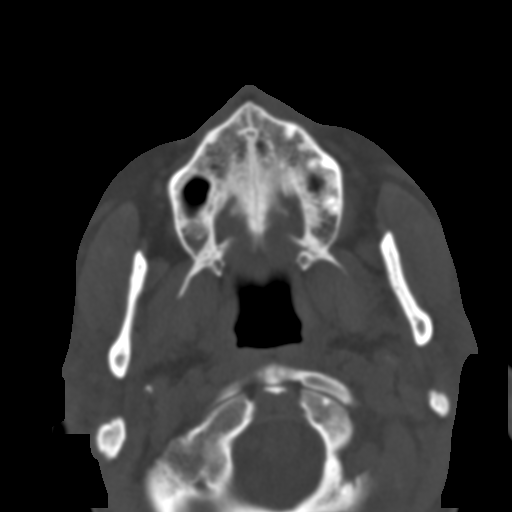
[im 47/85  bone]
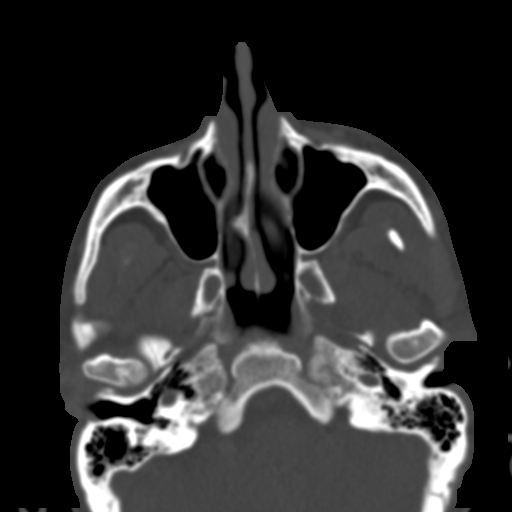
[im 56/85  bone]
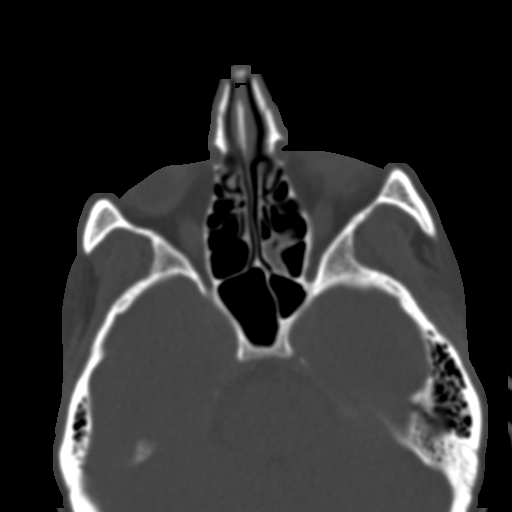
[im 64/85  bone]
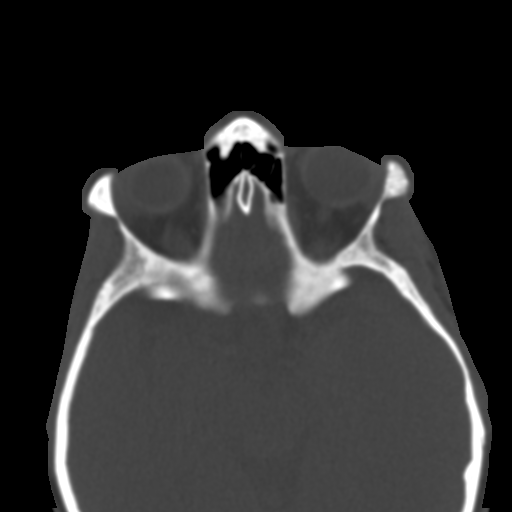
[im 70/85  brain]
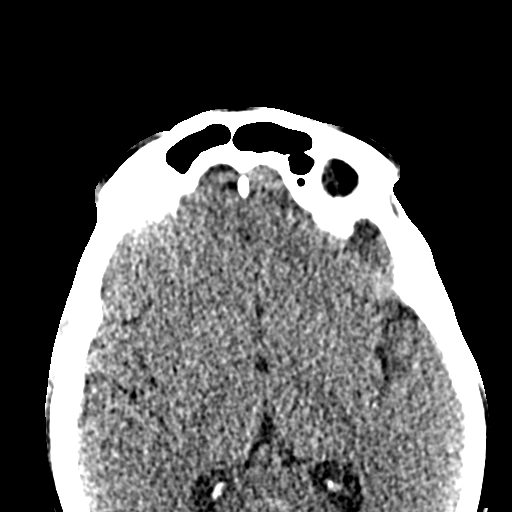
[im 70/85  bone]
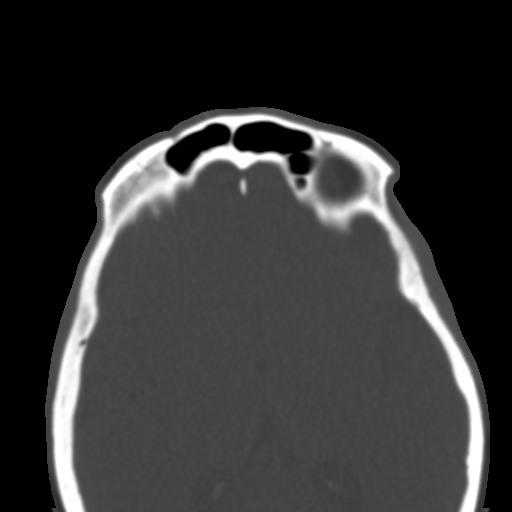
[im 79/85  bone]
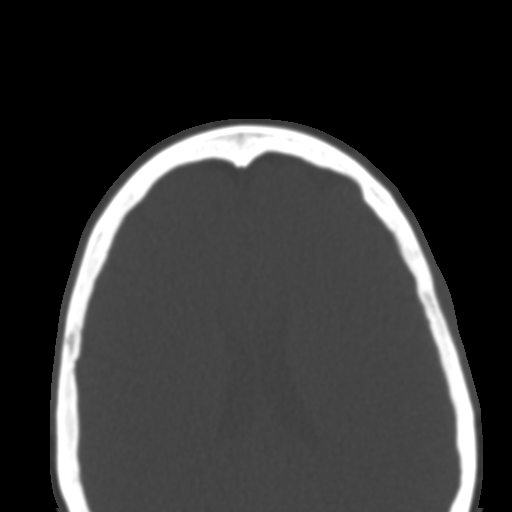

[Series 6: coronal soft · coronal · 0.31mm/px · 3 of 80 slices shown]
[im 27/80  bone]
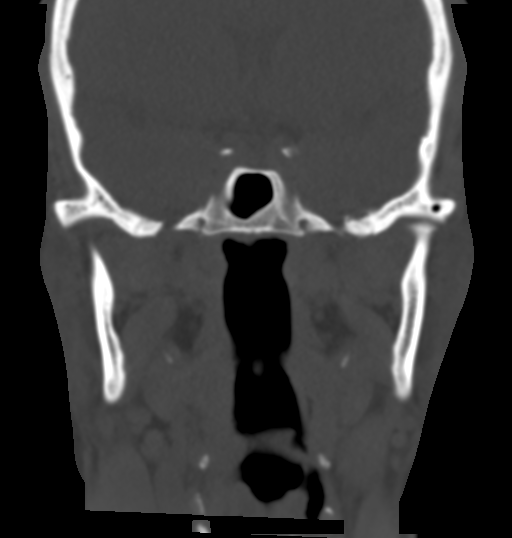
[im 36/80  bone]
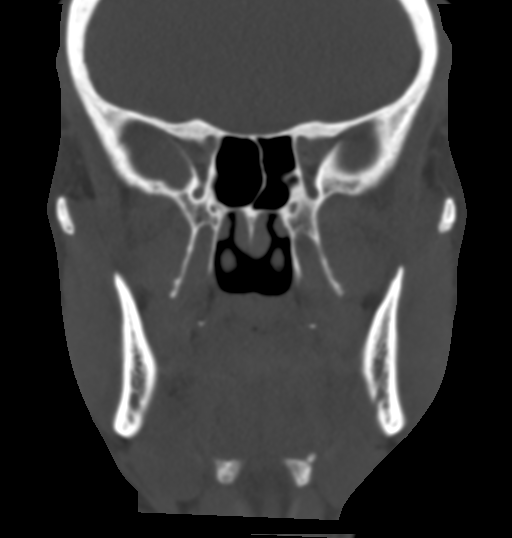
[im 44/80  bone]
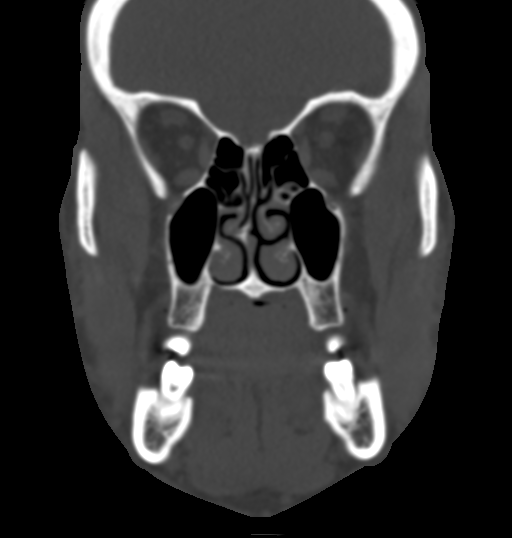

[Series 7: sagittal soft · sagittal · 0.31mm/px · 3 of 80 slices shown]
[im 27/80  bone]
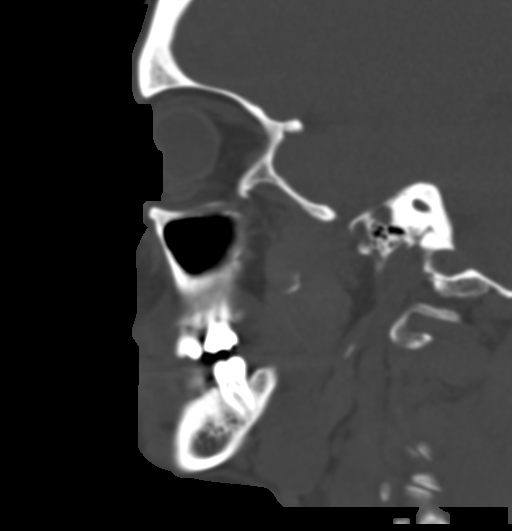
[im 40/80  bone]
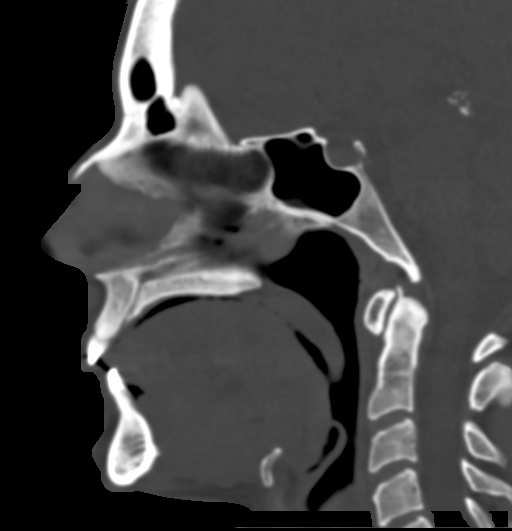
[im 53/80  bone]
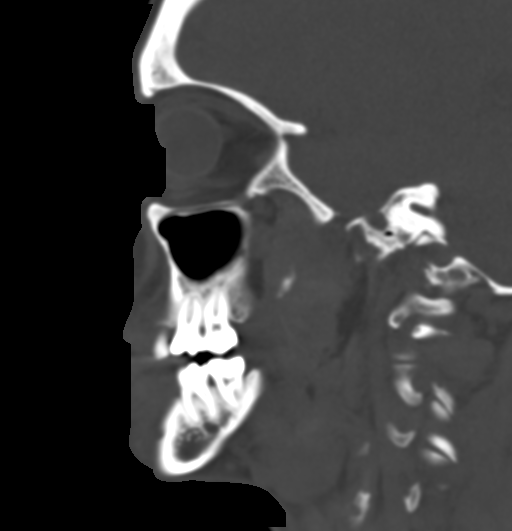

[16 of 47 positions shown; findings below may reference images not displayed]

FINDINGS: Osseous:

--Complex facial fracture types: No LeFort, zygomaticomaxillary
complex or nasoorbitoethmoidal fracture.

--Simple fracture types: None.

--Mandible, hard palate and teeth: No acute abnormality.

Orbits: The globes are intact. Normal appearance of the intra- and
extraconal fat. Symmetric extraocular muscles.

Sinuses: No fluid levels or advanced mucosal thickening.

Soft tissues: Normal visualized extracranial soft tissues.

Limited intracranial: Normal.
IMPRESSION: No facial fracture.

## 2022-06-23 ENCOUNTER — Telehealth: Payer: Self-pay

## 2022-06-23 NOTE — Telephone Encounter (Signed)
LVM for patient to call back. AS, CMA 

## 2022-11-15 ENCOUNTER — Other Ambulatory Visit (HOSPITAL_BASED_OUTPATIENT_CLINIC_OR_DEPARTMENT_OTHER): Payer: Self-pay

## 2022-11-15 ENCOUNTER — Encounter (HOSPITAL_BASED_OUTPATIENT_CLINIC_OR_DEPARTMENT_OTHER): Payer: Self-pay | Admitting: Emergency Medicine

## 2022-11-15 ENCOUNTER — Other Ambulatory Visit: Payer: Self-pay

## 2022-11-15 ENCOUNTER — Emergency Department (HOSPITAL_BASED_OUTPATIENT_CLINIC_OR_DEPARTMENT_OTHER)
Admission: EM | Admit: 2022-11-15 | Discharge: 2022-11-15 | Disposition: A | Discharge status: Home / Self Care | Payer: Medicaid Other | Attending: Emergency Medicine | Admitting: Emergency Medicine

## 2022-11-15 DIAGNOSIS — M545 Low back pain, unspecified: Secondary | ICD-10-CM

## 2022-11-15 MED ORDER — KETOROLAC TROMETHAMINE 30 MG/ML IJ SOLN
30.0000 mg | Freq: Once | INTRAMUSCULAR | Status: AC
  Administered 2022-11-15: 30 mg via INTRAMUSCULAR
  Filled 2022-11-15: qty 1

## 2022-11-15 MED ORDER — PREDNISONE 20 MG PO TABS
ORAL_TABLET | ORAL | 0 refills | Status: AC
  Filled 2022-11-15: qty 23, 10d supply, fill #0

## 2022-11-15 MED ORDER — DIAZEPAM 2 MG PO TABS
2.0000 mg | ORAL_TABLET | Freq: Every day | ORAL | 0 refills | Status: AC
  Filled 2022-11-15: qty 5, 5d supply, fill #0
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
## 2022-11-15 NOTE — ED Notes (Signed)
Discharge paperwork given and verbally understood. 

## 2022-11-15 NOTE — ED Provider Notes (Signed)
[place] EMERGENCY DEPARTMENT AT [facility] Provider Note   CSN: [id number] Arrival date & time: 11/15/22  1139     History  Chief Complaint  Patient presents with   Back Pain    [name] is a 47 y.o. female with past medical history significant for ADHD, kidney stones, degenerative disc disease presents to the ED complaining of lower back pain.  Patient states she went to pick up her son and turned wrong feeling a "pop" in her back around 6 AM.  She took Flexeril, Excedrin, and ibuprofen without relief of symptoms.  She reports she occasionally has flareups of back pain every couple of years and has had to have steroid injections in her spine before.  No previous back surgeries.  She has increased pain in certain positions and when attempting to stand up straight.  She is able to walk, but has to hunch forward to do so.  Denies numbness or weakness in the lower extremities, loss of bladder or bowel control.       Home Medications Prior to Admission medications   Medication Sig Start Date End Date Taking? Authorizing Provider  diazepam (VALIUM) 2 MG tablet Take 1 tablet (2 mg total) by mouth at bedtime. 11/15/22  Yes Markavious Micco R, PA-C  predniSONE (DELTASONE) 20 MG tablet Take 3 tablets (60 mg total) by mouth daily for 5 days, THEN 2 tablets (40 mg total) daily for 3 days, THEN 1 tablet (20 mg total) daily for 2 days. 11/15/22 11/25/22 Yes Tzion Wedel R, PA-C  amphetamine-dextroamphetamine (ADDERALL) 20 MG tablet Take 20 mg by mouth 3 (three) times daily as needed.     [provider]  ibuprofen (ADVIL) 800 MG tablet Take 1 tablet (800 mg total) by mouth every 8 (eight) hours as needed (for pain). 10/31/20   Molpus, John, MD      Allergies    Penicillins, Sulfa antibiotics, and Pseudoephedrine hcl    Review of Systems   Review of Systems  Musculoskeletal:  Positive for back pain and gait problem.  Neurological:  Negative for weakness and numbness.     Physical Exam Updated Vital Signs BP 127/76   Pulse 95   Temp 98.2 F (36.8 C) (Oral)   Resp 16   LMP 11/01/2022 (Approximate)   SpO2 100%  Physical Exam Vitals and nursing note reviewed.  Constitutional:      General: She is not in acute distress.    Appearance: Normal appearance. She is not ill-appearing or diaphoretic.  Cardiovascular:     Rate and Rhythm: Normal rate and regular rhythm.  Pulmonary:     Effort: Pulmonary effort is normal.  Musculoskeletal:     Lumbar back: Tenderness and bony tenderness present. Negative right straight leg raise test and negative left straight leg raise test.     Comments: Decreased ROM of the spine, with increased pain on extension.  Patient is able to change positions without assistance, but does have increase in pain.  Disc spaces are palpated.  Bony tenderness to palpation on the right aspect of the lumbar spine along spinous processes.  No obvious deformity or palpable masses.   Skin:    General: Skin is warm and dry.     Capillary Refill: Capillary refill takes less than 2 seconds.  Neurological:     Mental Status: She is alert. Mental status is at baseline.     Motor: No weakness.     Comments: 5/5 strength in BLE.   Psychiatric:  Mood and Affect: Mood normal.        Behavior: Behavior normal.     ED Results / Procedures / Treatments   Labs (all labs ordered are listed, but only abnormal results are displayed) Labs Reviewed - No data to display  EKG None  Radiology No results found.  Procedures Procedures    Medications Ordered in ED Medications  ketorolac (TORADOL) 30 MG/ML injection 30 mg (30 mg Intramuscular Given 11/15/22 1212)    ED Course/ Medical Decision Making/ A&P                                 Medical Decision Making Risk Prescription drug management.   This patient presents to the ED with chief complaint(s) of low back pain with pertinent past medical history of degenerative disc  disease, facet arthrtitis.  The complaint involves an extensive differential diagnosis and also carries with it a high risk of complications and morbidity.    The differential diagnosis includes lumbar strain, bulging disc, herniated disc, facet arthrosis    Initial Assessment:   Exam significant for decreased ROM of the spine, with increased pain on extension.  Patient is able to change positions without assistance, but does have increase in pain.  Disc spaces are palpated.  Bony tenderness to palpation on the right aspect of the lumbar spine along spinous processes.  No obvious deformity or palpable masses.  Patient is able to ambulate, but cannot fully stand up straight.  5/5 strength in bilateral lower extremities.  BLE neurovascularly intact.  Normal ROM of the hips and lower extremities.  Treatment and Reassessment: Patient given IM injection of Toradol.  Disposition:   Will send patient home on 10 day taper of prednisone to treat acute low back pain.  Patient also given Valium for night time use to treat muscle spasms.  I do not feel that imaging would be beneficial at this time.  Suspect flare related to known degenerative disc disease and facet arthritis.  Recommended patient follow-up outpatient with ortho spine specialist.  Discussed supportive care measures for symptom management at home and patient provided with exercises to help with lumbar strain.  The patient has been appropriately medically screened and/or stabilized in the ED. I have low suspicion for any other emergent medical condition which would require further screening, evaluation or treatment in the ED or require inpatient management. At time of discharge the patient is hemodynamically stable and in no acute distress. I have discussed work-up results and diagnosis with patient and answered all questions. Patient is agreeable with discharge plan. We discussed strict return precautions for returning to the emergency department and  they verbalized understanding.           Final Clinical Impression(s) / ED Diagnoses Final diagnoses:  Acute low back pain without sciatica, unspecified back pain laterality    Rx / DC Orders ED Discharge Orders          Ordered    predniSONE (DELTASONE) 20 MG tablet  Daily        11/15/22 1202    diazepam (VALIUM) 2 MG tablet  Daily at bedtime        11/15/22 1214              Melton Alar R, PA-C 11/15/22 1226    Anders Simmonds T, DO 11/21/22 2007

## 2022-11-15 NOTE — Discharge Instructions (Addendum)
Thank you for allowing [place] to be a part of your care today.  A prescription for prednisone has been sent to the pharmacy for you to take over the next 10 days to help with your back pain.  Do not take medication such as ibuprofen or naproxen while taking this medicine.  You may take 1000 mg of Tylenol every 6 hours as needed for pain.  Do not exceed 4000 g of Tylenol in a 24-hour period.  You may continue to take your Flexeril during the day.  I have prescribed you Valium to take at bedtime to help with muscle spasms.  Do not consume alcohol, drive, or operate heavy machinery while taking this medication as it is sedating.  I have attached information for exercises you may do at home to help with your back pain.  If any of them cause pain, discontinue.  Use ice or heat on your back to help with relief of symptoms, whichever feels better to you.  You may also use topical anti-inflammatories (Voltaren, Salonpas) and/or lidocaine patches which you can find over-the-counter.  Follow-up with orthopedics.  Return to the ED if develop sudden worsening of your symptoms, have weakness in your legs, loss of bladder or bowel control, or if you have any new concerns.

## 2024-01-29 ENCOUNTER — Other Ambulatory Visit (HOSPITAL_COMMUNITY): Payer: Self-pay

## 2024-03-12 ENCOUNTER — Emergency Department (HOSPITAL_BASED_OUTPATIENT_CLINIC_OR_DEPARTMENT_OTHER): Payer: Self-pay

## 2024-03-12 ENCOUNTER — Emergency Department (HOSPITAL_BASED_OUTPATIENT_CLINIC_OR_DEPARTMENT_OTHER)
Admission: EM | Admit: 2024-03-12 | Discharge: 2024-03-12 | Disposition: A | Discharge status: Home / Self Care | Payer: Self-pay | Attending: Emergency Medicine | Admitting: Emergency Medicine

## 2024-03-12 ENCOUNTER — Other Ambulatory Visit: Payer: Self-pay

## 2024-03-12 DIAGNOSIS — D72819 Decreased white blood cell count, unspecified: Secondary | ICD-10-CM | POA: Insufficient documentation

## 2024-03-12 DIAGNOSIS — J101 Influenza due to other identified influenza virus with other respiratory manifestations: Secondary | ICD-10-CM | POA: Insufficient documentation

## 2024-03-12 DIAGNOSIS — N3000 Acute cystitis without hematuria: Secondary | ICD-10-CM | POA: Insufficient documentation

## 2024-03-12 LAB — CBC WITH DIFFERENTIAL/PLATELET
Abs Immature Granulocytes: 0.01 10*3/uL (ref 0.00–0.07)
Basophils Absolute: 0 10*3/uL (ref 0.0–0.1)
Basophils Relative: 0 %
Eosinophils Absolute: 0.1 10*3/uL (ref 0.0–0.5)
Eosinophils Relative: 1 %
HCT: 35.4 % — ABNORMAL LOW (ref 36.0–46.0)
Hemoglobin: 12.4 g/dL (ref 12.0–15.0)
Immature Granulocytes: 0 %
Lymphocytes Relative: 25 %
Lymphs Abs: 1 10*3/uL (ref 0.7–4.0)
MCH: 29.9 pg (ref 26.0–34.0)
MCHC: 35 g/dL (ref 30.0–36.0)
MCV: 85.3 fL (ref 80.0–100.0)
Monocytes Absolute: 0.3 10*3/uL (ref 0.1–1.0)
Monocytes Relative: 7 %
Neutro Abs: 2.7 10*3/uL (ref 1.7–7.7)
Neutrophils Relative %: 67 %
Platelets: 292 10*3/uL (ref 150–400)
RBC: 4.15 MIL/uL (ref 3.87–5.11)
RDW: 12.4 % (ref 11.5–15.5)
WBC: 4.1 10*3/uL (ref 4.0–10.5)
nRBC: 0 % (ref 0.0–0.2)

## 2024-03-12 LAB — BASIC METABOLIC PANEL WITH GFR
Anion gap: 12 (ref 5–15)
BUN: 15 mg/dL (ref 6–20)
CO2: 24 mmol/L (ref 22–32)
Calcium: 8.9 mg/dL (ref 8.9–10.3)
Chloride: 101 mmol/L (ref 98–111)
Creatinine, Ser: 0.83 mg/dL (ref 0.44–1.00)
GFR, Estimated: >60 mL/min
Glucose, Bld: 87 mg/dL (ref 70–99)
Potassium: 3.6 mmol/L (ref 3.5–5.1)
Sodium: 137 mmol/L (ref 135–145)

## 2024-03-12 LAB — URINALYSIS, ROUTINE W REFLEX MICROSCOPIC
Bilirubin Urine: NEGATIVE
Glucose, UA: NEGATIVE mg/dL
Hgb urine dipstick: NEGATIVE
Ketones, ur: NEGATIVE mg/dL
Nitrite: POSITIVE — AB
Specific Gravity, Urine: 1.031 — ABNORMAL HIGH (ref 1.005–1.030)
pH: 6.5 (ref 5.0–8.0)

## 2024-03-12 LAB — RESP PANEL BY RT-PCR (RSV, FLU A&B, COVID)  RVPGX2
Influenza A by PCR: NEGATIVE
Influenza B by PCR: POSITIVE — AB
Resp Syncytial Virus by PCR: NEGATIVE
SARS Coronavirus 2 by RT PCR: NEGATIVE

## 2024-03-12 LAB — PREGNANCY, URINE: Preg Test, Ur: NEGATIVE

## 2024-03-12 LAB — GROUP A STREP BY PCR: Group A Strep by PCR: NOT DETECTED

## 2024-03-12 MED ORDER — CIPROFLOXACIN HCL 500 MG PO TABS: 500.0000 mg | ORAL_TABLET | Freq: Two times a day (BID) | ORAL | 0 refills | Status: AC

## 2024-03-12 MED ORDER — BENZONATATE 100 MG PO CAPS
100.0000 mg | ORAL_CAPSULE | Freq: Once | ORAL | Status: AC
  Administered 2024-03-12: 100 mg via ORAL
  Filled 2024-03-12: qty 1

## 2024-03-12 MED ORDER — BENZONATATE 100 MG PO CAPS: 100.0000 mg | ORAL_CAPSULE | Freq: Three times a day (TID) | ORAL | 0 refills | Status: AC

## 2024-03-12 NOTE — Discharge Instructions (Signed)
 It was a pleasure taking care of you today.  As discussed, your flu test was positive which is likely causing your symptoms.  I am sending you home with cough medication.  Take as needed.  You may take over-the-counter ibuprofen  or Tylenol  as needed for pain and fever.  Your urine also showed an infection.  I am sending you home with antibiotics.  Take as prescribed and finish all antibiotics.  Please follow-up with PCP if symptoms do not improve over the next few days.  Return to the ER for any worsening symptoms.

## 2024-03-12 NOTE — ED Triage Notes (Signed)
 Pt caox4 ambulatory c/o fever, productive cough, sore throat, headache x1 wk.

## 2024-03-12 NOTE — ED Provider Notes (Signed)
 " [place] [facility] AT [facility] Provider Note   CSN: [id number] Arrival date & time: 03/12/24  1016     Patient presents with: Fever   [name] is a 49 y.o. female with a past medical history significant for ADHD, adjustment disorder, and history of opiate overdose who presents to the ED due to productive cough, fever, sore throat, and fatigue x 1 week.  Admits to intermittent fever.  Last fever yesterday.  Tmax 103 F.  Kids sick with similar symptoms.  Admits to some shortness of breath.  Denies abdominal pain, nausea, vomiting, and diarrhea.  Notes she feels puffy.  Also admits to malodorous urine.  Concern for UTI.  Notes she does not typically have typical symptoms of UTIs.  Denies any concern for STIs.  No vaginal discharge.  No history of asthma.  History obtained from patient and past medical records. No interpreter used during encounter.       Prior to Admission medications  Medication Sig Start Date End Date Taking? Authorizing Provider  benzonatate  (TESSALON ) 100 MG capsule Take 1 capsule (100 mg total) by mouth every 8 (eight) hours. 03/12/24  Yes Asia Favata, Aleck BROCKS, PA-C  ciprofloxacin  (CIPRO ) 500 MG tablet Take 1 tablet (500 mg total) by mouth every 12 (twelve) hours. 03/12/24  Yes Makaylee Spielberg C, PA-C  amphetamine-dextroamphetamine (ADDERALL) 20 MG tablet Take 20 mg by mouth 3 (three) times daily as needed.     [provider]  diazepam  (VALIUM ) 2 MG tablet Take 1 tablet (2 mg total) by mouth at bedtime. 11/15/22   Clark, Meghan R, PA-C  ibuprofen  (ADVIL ) 800 MG tablet Take 1 tablet (800 mg total) by mouth every 8 (eight) hours as needed (for pain). 10/31/20   Molpus, John, MD    Allergies: Penicillins, Sulfa antibiotics, and Pseudoephedrine hcl    Review of Systems  Constitutional:  Positive for fatigue and fever.  HENT:  Positive for sore throat. Negative for trouble swallowing.   Respiratory:  Positive for cough and  shortness of breath.   Cardiovascular:  Negative for chest pain.  Gastrointestinal:  Negative for abdominal pain.  Genitourinary:  Negative for dysuria, flank pain, frequency and vaginal discharge.    Updated Vital Signs BP 119/76 (BP Location: Right Arm)   Pulse 89   Temp 98.3 F (36.8 C) (Oral)   Resp 18   SpO2 100%   Physical Exam Vitals and nursing note reviewed.  Constitutional:      General: She is not in acute distress.    Appearance: She is not ill-appearing.  HENT:     Head: Normocephalic.     Mouth/Throat:     Comments: Posterior oropharynx clear and mucous membranes moist, there is mild erythema but no edema or tonsillar exudates, uvula midline, normal phonation, no trismus, tolerating secretions without difficulty. Eyes:     Pupils: Pupils are equal, round, and reactive to light.  Cardiovascular:     Rate and Rhythm: Normal rate and regular rhythm.     Pulses: Normal pulses.     Heart sounds: Normal heart sounds. No murmur heard.    No friction rub. No gallop.  Pulmonary:     Effort: Pulmonary effort is normal.     Breath sounds: Normal breath sounds.  Abdominal:     General: Abdomen is flat. There is no distension.     Palpations: Abdomen is soft.     Tenderness: There is no abdominal tenderness. There is no guarding or rebound.  Musculoskeletal:        General: Normal range of motion.     Cervical back: Neck supple.  Skin:    General: Skin is warm and dry.  Neurological:     General: No focal deficit present.     Mental Status: She is alert.  Psychiatric:        Mood and Affect: Mood normal.        Behavior: Behavior normal.     (all labs ordered are listed, but only abnormal results are displayed) Labs Reviewed  RESP PANEL BY RT-PCR (RSV, FLU A&B, COVID)  RVPGX2 - Abnormal; Notable for the following components:      Result Value   Influenza B by PCR POSITIVE (*)    All other components within normal limits  URINALYSIS, ROUTINE W REFLEX  MICROSCOPIC - Abnormal; Notable for the following components:   APPearance HAZY (*)    Specific Gravity, Urine 1.031 (*)    Protein, ur TRACE (*)    Nitrite POSITIVE (*)    Leukocytes,Ua SMALL (*)    Bacteria, UA MANY (*)    All other components within normal limits  CBC WITH DIFFERENTIAL/PLATELET - Abnormal; Notable for the following components:   HCT 35.4 (*)    All other components within normal limits  GROUP A STREP BY PCR  PREGNANCY, URINE  BASIC METABOLIC PANEL WITH GFR    EKG: None  Radiology: DG Chest Portable 1 View Result Date: 03/12/2024 CLINICAL DATA:  SOB EXAM: PORTABLE CHEST 1 VIEW COMPARISON:  April 02, 2018 FINDINGS: The cardiomediastinal silhouette is normal in contour. No pleural effusion. No pneumothorax. No acute pleuroparenchymal abnormality. IMPRESSION: No acute cardiopulmonary abnormality. Electronically Signed   By: Corean Salter M.D.   On: 03/12/2024 11:36     Procedures   Medications Ordered in the ED  benzonatate  (TESSALON ) capsule 100 mg (100 mg Oral Given 03/12/24 1114)    Clinical Course as of 03/12/24 1228  Tue Mar 12, 2024  1124 Nitrite(!): POSITIVE [CA]  [address]): SMALL [CA]  1124 WBC, UA: 11-20 [CA]  1124 Bacteria, UA(!): MANY [CA]  1155 Influenza B By PCR(!): POSITIVE [CA]    Clinical Course User Index [CA] Lorelle Aleck BROCKS, PA-C                                 Medical Decision Making Amount and/or Complexity of Data Reviewed Labs: ordered. Decision-making details documented in ED Course. Radiology: ordered and independent interpretation performed. Decision-making details documented in ED Course. ECG/medicine tests: ordered and independent interpretation performed. Decision-making details documented in ED Course.  Risk Prescription drug management.   This patient presents to the ED for concern of cough, fever, SOB, this involves an extensive number of treatment options, and is a complaint that carries with  it a high risk of complications and morbidity.  The differential diagnosis includes viral process, PNA, strep throat, etc  49 year old female presents to the ED due to flulike symptoms x 1 week.  Intermittent fever.  Tmax 103 F.  Children sick with similar symptoms.  Also admits to malodorous urine with concerns for possible UTI.  Denies concerns for STIs.  Also admits to puffiness throughout her body.  Upon arrival patient afebrile, mildly tachycardic at 105 with normal O2 saturation.  Patient well-appearing on exam.  No edema noted on exam.  Throat with mild erythema.  Uvula midline.  No tonsillar hypertrophy or exudates. No  abscess. No meningismus to suggest meningitis.  Lungs clear to auscultation bilaterally.  Suspect viral etiology however, given other symptoms will obtain routine labs.  Chest x-ray to rule out evidence of pneumonia.  RVP and strep test ordered.  Tessalon  Perles given.  UA with positive nitrites, leukocytes, 11-20 white blood cells and many bacteria consistent with acute cystitis.  Patient has an anaphylactic reaction to penicillins.  It appears patient typically gets ciprofloxacin  for UTIs.  Patient discharged with prescription.  CBC reassuring.  No leukocytosis.  Normal hemoglobin.  BMP unremarkable.  Strep negative. RVP positive for influenza B which is likely causing patient's symptoms.  Chest x-ray personally reviewed and interpreted which is negative for signs of pneumonia or other acute abnormalities.  Patient out of Tamiflu window.  Discussed over-the-counter symptomatic treatment.  Patient discharged with Tessalon  Perles for cough and ciprofloxacin  for acute cystitis.  No evidence of respiratory distress.  Other workup reassuring.  Patient stable for discharge. Strict ED precautions discussed with patient. Patient states understanding and agrees to plan. Patient discharged home in no acute distress and stable vitals  Co morbidities that complicate the patient  evaluation  Hx ADHD Cardiac Monitoring: / EKG:  The patient was maintained on a cardiac monitor.  I personally viewed and interpreted the cardiac monitored which showed an underlying rhythm of: NSR, HR 83  Social Determinants of Health:  NO PCP     Final diagnoses:  Acute cystitis without hematuria  Influenza B    ED Discharge Orders          Ordered    ciprofloxacin  (CIPRO ) 500 MG tablet  Every 12 hours        03/12/24 1224    benzonatate  (TESSALON ) 100 MG capsule  Every 8 hours        03/12/24 1224               Lorelle Aleck BROCKS, PA-C 03/12/24 1228    Bari Roxie HERO, DO 03/12/24 1541  "
